# Patient Record
Sex: Female | Born: 1955 | Race: Black or African American | Hispanic: No | Marital: Single | Smoking: Former smoker
Health system: Southern US, Community
[De-identification: ages and names within clinical notes are randomized; demographics above are authoritative.]

## PROBLEM LIST (undated history)

## (undated) DIAGNOSIS — I10 Essential (primary) hypertension: Secondary | ICD-10-CM

## (undated) DIAGNOSIS — F329 Major depressive disorder, single episode, unspecified: Secondary | ICD-10-CM

## (undated) DIAGNOSIS — K921 Melena: Secondary | ICD-10-CM

## (undated) DIAGNOSIS — T7840XA Allergy, unspecified, initial encounter: Secondary | ICD-10-CM

## (undated) DIAGNOSIS — E079 Disorder of thyroid, unspecified: Secondary | ICD-10-CM

## (undated) DIAGNOSIS — E785 Hyperlipidemia, unspecified: Secondary | ICD-10-CM

## (undated) DIAGNOSIS — F419 Anxiety disorder, unspecified: Secondary | ICD-10-CM

## (undated) DIAGNOSIS — F32A Depression, unspecified: Secondary | ICD-10-CM

## (undated) DIAGNOSIS — R109 Unspecified abdominal pain: Secondary | ICD-10-CM

## (undated) HISTORY — DX: Allergy, unspecified, initial encounter: T78.40XA

## (undated) HISTORY — DX: Major depressive disorder, single episode, unspecified: F32.9

## (undated) HISTORY — DX: Melena: K92.1

## (undated) HISTORY — DX: Anxiety disorder, unspecified: F41.9

## (undated) HISTORY — DX: Unspecified abdominal pain: R10.9

## (undated) HISTORY — DX: Disorder of thyroid, unspecified: E07.9

## (undated) HISTORY — DX: Depression, unspecified: F32.A

---

## 1993-01-16 HISTORY — PX: ABDOMINAL HYSTERECTOMY: SHX81

## 2010-12-19 DIAGNOSIS — E669 Obesity, unspecified: Secondary | ICD-10-CM | POA: Insufficient documentation

## 2011-01-17 HISTORY — PX: COLONOSCOPY: SHX174

## 2012-05-09 ENCOUNTER — Other Ambulatory Visit (HOSPITAL_COMMUNITY): Payer: Self-pay | Admitting: Family Medicine

## 2012-05-09 DIAGNOSIS — R102 Pelvic and perineal pain: Secondary | ICD-10-CM

## 2012-05-10 ENCOUNTER — Ambulatory Visit (HOSPITAL_COMMUNITY)
Admission: RE | Admit: 2012-05-10 | Discharge: 2012-05-10 | Disposition: A | Discharge status: Home / Self Care | Payer: BC Managed Care – PPO | Source: Ambulatory Visit | Attending: Family Medicine | Admitting: Family Medicine

## 2012-05-10 DIAGNOSIS — Z90710 Acquired absence of both cervix and uterus: Secondary | ICD-10-CM | POA: Insufficient documentation

## 2012-05-10 DIAGNOSIS — R102 Pelvic and perineal pain: Secondary | ICD-10-CM

## 2012-05-10 DIAGNOSIS — N949 Unspecified condition associated with female genital organs and menstrual cycle: Secondary | ICD-10-CM | POA: Insufficient documentation

## 2013-06-16 ENCOUNTER — Encounter (HOSPITAL_COMMUNITY): Payer: Self-pay | Admitting: Emergency Medicine

## 2013-06-16 ENCOUNTER — Emergency Department (HOSPITAL_COMMUNITY)
Admission: EM | Admit: 2013-06-16 | Discharge: 2013-06-16 | Disposition: A | Discharge status: Home / Self Care | Payer: Worker's Compensation | Attending: Emergency Medicine | Admitting: Emergency Medicine

## 2013-06-16 ENCOUNTER — Emergency Department (HOSPITAL_COMMUNITY): Payer: Worker's Compensation

## 2013-06-16 DIAGNOSIS — W010XXA Fall on same level from slipping, tripping and stumbling without subsequent striking against object, initial encounter: Secondary | ICD-10-CM | POA: Diagnosis not present

## 2013-06-16 DIAGNOSIS — Z87891 Personal history of nicotine dependence: Secondary | ICD-10-CM | POA: Diagnosis not present

## 2013-06-16 DIAGNOSIS — S5010XA Contusion of unspecified forearm, initial encounter: Secondary | ICD-10-CM | POA: Diagnosis not present

## 2013-06-16 DIAGNOSIS — I10 Essential (primary) hypertension: Secondary | ICD-10-CM | POA: Insufficient documentation

## 2013-06-16 DIAGNOSIS — S6990XA Unspecified injury of unspecified wrist, hand and finger(s), initial encounter: Secondary | ICD-10-CM | POA: Diagnosis present

## 2013-06-16 DIAGNOSIS — Y9289 Other specified places as the place of occurrence of the external cause: Secondary | ICD-10-CM | POA: Insufficient documentation

## 2013-06-16 DIAGNOSIS — Z79899 Other long term (current) drug therapy: Secondary | ICD-10-CM | POA: Insufficient documentation

## 2013-06-16 DIAGNOSIS — S59909A Unspecified injury of unspecified elbow, initial encounter: Secondary | ICD-10-CM | POA: Diagnosis present

## 2013-06-16 DIAGNOSIS — Y939 Activity, unspecified: Secondary | ICD-10-CM | POA: Insufficient documentation

## 2013-06-16 DIAGNOSIS — IMO0002 Reserved for concepts with insufficient information to code with codable children: Secondary | ICD-10-CM | POA: Insufficient documentation

## 2013-06-16 DIAGNOSIS — S5011XA Contusion of right forearm, initial encounter: Secondary | ICD-10-CM

## 2013-06-16 HISTORY — DX: Essential (primary) hypertension: I10

## 2013-06-16 NOTE — Discharge Instructions (Signed)
Apply ice to your arm. Take ibuprofen, 600 800 mg every 6-8 hours as needed for pain.  Contusion A contusion is a deep bruise. Contusions are the result of an injury that caused bleeding under the skin. The contusion may turn blue, purple, or yellow. Minor injuries will give you a painless contusion, but more severe contusions may stay painful and swollen for a few weeks.  CAUSES  A contusion is usually caused by a blow, trauma, or direct force to an area of the body. SYMPTOMS   Swelling and redness of the injured area.  Bruising of the injured area.  Tenderness and soreness of the injured area.  Pain. DIAGNOSIS  The diagnosis can be made by taking a history and physical exam. An X-ray, CT scan, or MRI may be needed to determine if there were any associated injuries, such as fractures. TREATMENT  Specific treatment will depend on what area of the body was injured. In general, the best treatment for a contusion is resting, icing, elevating, and applying cold compresses to the injured area. Over-the-counter medicines may also be recommended for pain control. Ask your caregiver what the best treatment is for your contusion. HOME CARE INSTRUCTIONS   Put ice on the injured area.  Put ice in a plastic bag.  Place a towel between your skin and the bag.  Leave the ice on for 15-20 minutes, 03-04 times a day.  Only take over-the-counter or prescription medicines for pain, discomfort, or fever as directed by your caregiver. Your caregiver may recommend avoiding anti-inflammatory medicines (aspirin, ibuprofen, and naproxen) for 48 hours because these medicines may increase bruising.  Rest the injured area.  If possible, elevate the injured area to reduce swelling. SEEK IMMEDIATE MEDICAL CARE IF:   You have increased bruising or swelling.  You have pain that is getting worse.  Your swelling or pain is not relieved with medicines. MAKE SURE YOU:   Understand these instructions.  Will  watch your condition.  Will get help right away if you are not doing well or get worse. Document Released: 10/12/2004 Document Revised: 03/27/2011 Document Reviewed: 11/07/2010 [facility] Patient Information 2014 [place], [place].

## 2013-06-16 NOTE — ED Provider Notes (Signed)
Discussed case with Johnnette Gourd, PA-C. Transfer of care from [employer], PA-C at change in shift.   Plan: Xray - anticipate discharge.   Dg Forearm Right  06/16/2013   CLINICAL DATA:  Traumatic injury and pain  EXAM: RIGHT FOREARM - 2 VIEW  COMPARISON:  None.  FINDINGS: There is no evidence of fracture or other focal bone lesions. Soft tissues are unremarkable.  IMPRESSION: No acute abnormality noted.   Electronically Signed   By: Alcide Clever M.D.   On: 06/16/2013 16:08   Alert and oriented. GCS 15. Heart rate and rhythm normal. Lungs clear to auscultation to upper and lower lobes bilaterally. Lungs clear to auscultation. Radial pulses 2+ bilaterally. Cap refill less than 3 seconds. Ecchymosis with superficial abrasions identified to the extensor surface of the right forearm. Negative snuffbox tenderness to the right wrist. Full range of motion to the right wrist without difficulty or ataxia. Negative pain upon palpation to the right hand and right wrist. Mild discomfort upon palpation to the area of ecchymosis of the right forearm. Negative pain upon palpation to the right elbow-full range of motion noted without difficulty or ataxia. Strength intact to the MCP, PIP, DIP joints of right hand. Equal grip strength noted.  Plain film of right forearm negative for acute osseous injury. Strength intact with equal distribution. Negative focal neurological deficits noted. Pulses palpable and strong. Patient neurovascularly intact. Patient stable, afebrile. Patient not septic appearing. Discharged patient. Patient placed in wrist brace for comfort. Discussed with patient to rest, ice, elevate. Suspicion to be contusion. Doubt compartment syndrome. Doubt ischemia. Negative findings for fractures or acute bony abnormalities. Referred to PCP and hand specialist. Discussed with patient to closely monitor symptoms and if symptoms are to worsen or change to report back to the ED - strict return instructions given.   Patient agreed to plan of care, understood, all questions answered.   Diagnoses that have been ruled out:  None  Diagnoses that are still under consideration:  None  Final diagnoses:  Contusion of right lower arm    Filed Vitals:   06/16/13 1322  BP: 121/80  Pulse: 96  Temp: 97.8 F (36.6 C)  TempSrc: Oral  Resp: 20  SpO2: 97%      [employer], PA-C 06/16/13 1651

## 2013-06-16 NOTE — ED Notes (Signed)
Patient states she fell Saturday at work in the bathroom when she slipped on some water.   Patient states unsure how she landed and unsure what she hit.   Patient complains of R wrist / arm pain.   Patient presents with bruising, swelling and abrasions.

## 2013-06-17 NOTE — ED Provider Notes (Signed)
Medical screening examination/treatment/procedure(s) were performed by non-physician practitioner and as supervising physician I was immediately available for consultation/collaboration.     Geoffery Lyons, MD 06/17/13 [phone]

## 2013-07-01 NOTE — ED Provider Notes (Signed)
CSN: [id number]     Arrival date & time 06/16/13  1316 History   First MD Initiated Contact with Patient 06/16/13 1329     Chief Complaint  Patient presents with  . Fall     (Consider location/radiation/quality/duration/timing/severity/associated sxs/prior Treatment) HPI Comments: This is a note from a visit on 6/1 that was deleted by the scribe, Sharmon LeydenKayla Anderson. 58 y/o female presents to the ED complaining of right wrist and arm pain x3 days after slipping on water in the bathroom at work landing on her right arm. States it started to bruise shortly after. Pain is constant and worse with movement of her right wrist, severe. Denies numbness or tingling. No head injury or LOC. She has not had any alleviating factors.  Patient is a 58 y.o. female presenting with fall. The history is provided by the patient.  Fall Pertinent negatives include no numbness.    Past Medical History  Diagnosis Date  . Hypertension    Past Surgical History  Procedure Laterality Date  . Abdominal hysterectomy     No family history on file. History  Substance Use Topics  . Smoking status: Former Games developermoker  . Smokeless tobacco: Not on file  . Alcohol Use: No   OB History   Grav Para Term Preterm Abortions TAB SAB Ect Mult Living                 Review of Systems  Musculoskeletal:       Positive for right forearm and wrist pain.  Skin: Positive for wound.  Neurological: Negative for numbness.  All other systems reviewed and are negative.     Allergies  Review of patient's allergies indicates no known allergies.  Home Medications   Prior to Admission medications   Medication Sig Start Date End Date Taking? Authorizing Provider  Cholecalciferol (VITAMIN D PO) Take 1 tablet by mouth daily.   Yes Historical Provider, MD  hydrochlorothiazide (MICROZIDE) 12.5 MG capsule Take 12.5 mg by mouth daily.   Yes Historical Provider, MD  LISINOPRIL PO Take 1 tablet by mouth daily.   Yes Historical Provider,  MD  SIMVASTATIN PO Take 1 tablet by mouth daily.   Yes Historical Provider, MD   BP 135/86  Pulse 66  Temp(Src) 98.5 F (36.9 C) (Oral)  Resp 20  SpO2 100% Physical Exam  Nursing note and vitals reviewed. Constitutional: She is oriented to person, place, and time. She appears well-developed and well-nourished. No distress.  HENT:  Head: Normocephalic and atraumatic.  Mouth/Throat: Oropharynx is clear and moist.  Eyes: Conjunctivae and EOM are normal.  Neck: Normal range of motion. Neck supple.  Cardiovascular: Normal rate, regular rhythm and normal heart sounds.   +2 radial pulse on right.  Pulmonary/Chest: Effort normal and breath sounds normal. No respiratory distress.  Musculoskeletal: Normal range of motion. She exhibits no edema.  Right forearm TTP dorsally with overlying ecchymosis and superficial abrasion. No active bleeding. Right wrist no TTP, full ROM. Right elbow normal.  Neurological: She is alert and oriented to person, place, and time. No sensory deficit.  Sensation intact. Equal grip strength bilateral.  Skin: Skin is warm and dry.  Psychiatric: She has a normal mood and affect. Her behavior is normal.    ED Course  Procedures (including critical care time) Labs Review Labs Reviewed - No data to display  Imaging Review No results found.   EKG Interpretation None      MDM   Final diagnoses:  Contusion of right  lower arm   Pt presenting with right arm pain after fall. She is well appearing and in NAD. VSS. Neurovascularly intact. Forearm xray was ordered and pending at shift change, pt care was signed out to [employer]issa [employer], PA-C.  Trevor MaceRobyn M Albert, PA-C 07/01/13 1601

## 2013-07-02 NOTE — ED Provider Notes (Signed)
Medical screening examination/treatment/procedure(s) were performed by non-physician practitioner and as supervising physician I was immediately available for consultation/collaboration.     Douglas Delo, MD 07/02/13 1459 

## 2015-08-27 ENCOUNTER — Other Ambulatory Visit (HOSPITAL_COMMUNITY): Payer: Self-pay | Admitting: *Deleted

## 2015-08-27 DIAGNOSIS — M79622 Pain in left upper arm: Secondary | ICD-10-CM

## 2015-09-15 ENCOUNTER — Telehealth (HOSPITAL_COMMUNITY): Payer: Self-pay | Admitting: *Deleted

## 2015-09-15 NOTE — Telephone Encounter (Signed)
Telephoned patient at home number and left message about [facility] appointment Thursday August 31 3:00

## 2015-09-16 ENCOUNTER — Ambulatory Visit
Admission: RE | Admit: 2015-09-16 | Discharge: 2015-09-16 | Disposition: A | Discharge status: Home / Self Care | Payer: No Typology Code available for payment source | Source: Ambulatory Visit | Attending: Obstetrics and Gynecology | Admitting: Obstetrics and Gynecology

## 2015-09-16 ENCOUNTER — Other Ambulatory Visit (HOSPITAL_COMMUNITY): Payer: Self-pay | Admitting: *Deleted

## 2015-09-16 ENCOUNTER — Ambulatory Visit (HOSPITAL_COMMUNITY)
Admission: RE | Admit: 2015-09-16 | Discharge: 2015-09-16 | Disposition: A | Discharge status: Home / Self Care | Payer: Self-pay | Source: Ambulatory Visit | Attending: Obstetrics and Gynecology | Admitting: Obstetrics and Gynecology

## 2015-09-16 ENCOUNTER — Ambulatory Visit
Admission: RE | Admit: 2015-09-16 | Discharge: 2015-09-16 | Disposition: A | Discharge status: Home / Self Care | Payer: Self-pay | Source: Ambulatory Visit | Attending: Obstetrics and Gynecology | Admitting: Obstetrics and Gynecology

## 2015-09-16 ENCOUNTER — Encounter (HOSPITAL_COMMUNITY): Payer: Self-pay

## 2015-09-16 VITALS — BP 122/84 | Temp 98.5°F | Ht 67.0 in | Wt 265.0 lb

## 2015-09-16 DIAGNOSIS — Z1239 Encounter for other screening for malignant neoplasm of breast: Secondary | ICD-10-CM

## 2015-09-16 DIAGNOSIS — N644 Mastodynia: Secondary | ICD-10-CM

## 2015-09-16 DIAGNOSIS — M79622 Pain in left upper arm: Secondary | ICD-10-CM

## 2015-09-16 HISTORY — DX: Hyperlipidemia, unspecified: E78.5

## 2015-09-16 NOTE — Patient Instructions (Signed)
Explained breast self awareness to [name]. Patient did not need a Pap smear today due to her history of a hysterectomy for benign reasons. Informed patient that she no longer needs Pap smears due to her history of a hysterectomy for benign reasons. [facility] and possible left breast ultrasound. Appointment scheduled for Thursday, September 16, 2015 at 1540. Sandra JarvisSherri Alvarez verbalized understanding.  [place], Kathaleen Maserhristine Poll, RN 9:52 PM

## 2015-09-16 NOTE — Progress Notes (Signed)
Complaints of left axillary lump x 3 weeks that has decreased in size. Complaints of bilateral axillary pain x 5 weeks that radiates to the breast. Patient states the pain comes and goes. Patient rates the pain at a 3 out of 10.  Pap Smear: Pap smear not completed today. [facility] and normal per patient. Per patient has no history of an abnormal Pap smear. Patient has a history of a hysterectomy in 1995 for fibroids. Patient doesn't need any further Pap smears due to her history of a hysterectomy for benign reasons. No Pap smear results are in EPIC.  Physical exam: Breasts Breasts symmetrical. No skin abnormalities bilateral breasts. No nipple retraction bilateral breasts. No nipple discharge bilateral breasts. No lymphadenopathy. No lumps palpated bilateral breasts. Unable to palpated any lumps in patients area of concern. Complaints of right outer breast and left axillary tenderness on exam. [facility] and possible left breast ultrasound. Appointment scheduled for Thursday, September 16, 2015 at 1540.        Pelvic/Bimanual No Pap smear completed today since patient has history of a hysterectomy for benign reasons.. Pap smear not indicated per BCCCP guidelines.   Smoking History: Patient has never smoked.  Patient Navigation: Patient education provided. Access to services provided for patient through BCCCP program.   Colorectal Cancer Screening: Per patient had a colonoscopy completed 5 years ago. No complaints today.

## 2015-09-17 ENCOUNTER — Encounter (HOSPITAL_COMMUNITY): Payer: Self-pay | Admitting: *Deleted

## 2015-10-11 ENCOUNTER — Telehealth (HOSPITAL_COMMUNITY): Payer: Self-pay | Admitting: *Deleted

## 2015-10-11 NOTE — Telephone Encounter (Signed)
Patient called and left voicemail in regards to a bill received. Attempted to call patient back and no one answered phone. Left voicemail for patient to call me back.

## 2015-10-12 ENCOUNTER — Telehealth (HOSPITAL_COMMUNITY): Payer: Self-pay | Admitting: *Deleted

## 2015-10-12 NOTE — Telephone Encounter (Signed)
Patient called me back and left voicemail. Called patient back. Patient stated she got a [facility] her [facility] and breast ultrasound on 09/16/15. Let patient know that it is covered by BCCCP. Told patient that I will need a copy of the bill and will send it to [facility] to have the charges removed from her account and billed to [facility]. Patient would like to e-mail me a copy of the bill. Gave patient my e-mail address. Let her know I will send it to be corrected after I receive. Patient verbalized understanding.

## 2017-11-28 ENCOUNTER — Ambulatory Visit: Payer: Self-pay | Admitting: Emergency Medicine

## 2018-01-01 ENCOUNTER — Encounter

## 2018-01-01 ENCOUNTER — Ambulatory Visit: Payer: Self-pay | Admitting: Emergency Medicine

## 2018-03-29 ENCOUNTER — Ambulatory Visit: Payer: Self-pay | Admitting: Emergency Medicine

## 2018-04-10 ENCOUNTER — Ambulatory Visit (INDEPENDENT_AMBULATORY_CARE_PROVIDER_SITE_OTHER): Payer: No Typology Code available for payment source | Admitting: Medical

## 2018-04-10 ENCOUNTER — Encounter: Payer: Self-pay | Admitting: Medical

## 2018-04-10 ENCOUNTER — Other Ambulatory Visit: Payer: Self-pay

## 2018-04-10 VITALS — BP 124/80 | HR 77 | Temp 97.9°F | Ht 65.5 in | Wt 281.8 lb

## 2018-04-10 DIAGNOSIS — F329 Major depressive disorder, single episode, unspecified: Secondary | ICD-10-CM | POA: Insufficient documentation

## 2018-04-10 DIAGNOSIS — Z1211 Encounter for screening for malignant neoplasm of colon: Secondary | ICD-10-CM | POA: Insufficient documentation

## 2018-04-10 DIAGNOSIS — R21 Rash and other nonspecific skin eruption: Secondary | ICD-10-CM | POA: Insufficient documentation

## 2018-04-10 DIAGNOSIS — F419 Anxiety disorder, unspecified: Secondary | ICD-10-CM

## 2018-04-10 DIAGNOSIS — K635 Polyp of colon: Secondary | ICD-10-CM

## 2018-04-10 DIAGNOSIS — Z1239 Encounter for other screening for malignant neoplasm of breast: Secondary | ICD-10-CM

## 2018-04-10 DIAGNOSIS — Z8262 Family history of osteoporosis: Secondary | ICD-10-CM | POA: Insufficient documentation

## 2018-04-10 DIAGNOSIS — Z780 Asymptomatic menopausal state: Secondary | ICD-10-CM

## 2018-04-10 DIAGNOSIS — I10 Essential (primary) hypertension: Secondary | ICD-10-CM | POA: Insufficient documentation

## 2018-04-10 DIAGNOSIS — G8929 Other chronic pain: Secondary | ICD-10-CM

## 2018-04-10 DIAGNOSIS — Z1159 Encounter for screening for other viral diseases: Secondary | ICD-10-CM

## 2018-04-10 DIAGNOSIS — Z0000 Encounter for general adult medical examination without abnormal findings: Secondary | ICD-10-CM | POA: Diagnosis not present

## 2018-04-10 DIAGNOSIS — E079 Disorder of thyroid, unspecified: Secondary | ICD-10-CM

## 2018-04-10 DIAGNOSIS — E2839 Other primary ovarian failure: Secondary | ICD-10-CM | POA: Insufficient documentation

## 2018-04-10 DIAGNOSIS — Z8709 Personal history of other diseases of the respiratory system: Secondary | ICD-10-CM

## 2018-04-10 DIAGNOSIS — Z90710 Acquired absence of both cervix and uterus: Secondary | ICD-10-CM | POA: Insufficient documentation

## 2018-04-10 DIAGNOSIS — E559 Vitamin D deficiency, unspecified: Secondary | ICD-10-CM

## 2018-04-10 DIAGNOSIS — E785 Hyperlipidemia, unspecified: Secondary | ICD-10-CM

## 2018-04-10 DIAGNOSIS — M25562 Pain in left knee: Secondary | ICD-10-CM

## 2018-04-10 DIAGNOSIS — H0260 Xanthelasma of unspecified eye, unspecified eyelid: Secondary | ICD-10-CM | POA: Insufficient documentation

## 2018-04-10 DIAGNOSIS — Z7185 Encounter for immunization safety counseling: Secondary | ICD-10-CM | POA: Insufficient documentation

## 2018-04-10 DIAGNOSIS — Z23 Encounter for immunization: Secondary | ICD-10-CM

## 2018-04-10 DIAGNOSIS — Z7189 Other specified counseling: Secondary | ICD-10-CM

## 2018-04-10 DIAGNOSIS — Z113 Encounter for screening for infections with a predominantly sexual mode of transmission: Secondary | ICD-10-CM

## 2018-04-10 LAB — POCT URINALYSIS DIP (PROADVANTAGE DEVICE)
BILIRUBIN UA: NEGATIVE
Blood, UA: NEGATIVE
Glucose, UA: NEGATIVE mg/dL
Ketones, POC UA: NEGATIVE mg/dL
Leukocytes, UA: NEGATIVE
Nitrite, UA: NEGATIVE
Protein Ur, POC: NEGATIVE mg/dL
Specific Gravity, Urine: 1.03
UUROB: 3.5
pH, UA: 6 (ref 5.0–8.0)

## 2018-04-10 NOTE — Progress Notes (Signed)
Subjective: Chief Complaint  Patient presents with  . other    non fasting CPE Last ate within the hour   Was seeing indigent care center prior, [facility].   [facility] on [place].    Sees eye doctor, dentist Last colonoscopy [facility] 2013  Concerns: Having left knee problems.  Left knee pain and some knee swelling the past year.   Last xray was several years ago.  Dr. Magnus Ivan.  No prior surgery.     Having some stiffness of joints for a few years.     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  No hx/o TB.   Depression and anxiety - wants to get back on track taking them regularly.  Current taking Citalopram  daily, Buspar prn.    S/p hysterectomy.  No current concerns for STD.   Has hx/o STD though.  Reviewed their medical, surgical, family, social, medication, and allergy history and updated chart as appropriate.  Past Medical History:  Diagnosis Date  . Allergy   . Anxiety   . Depression    prior [facility] at [facility]  . Hyperlipidemia   . Hypertension   . Thyroid disease     Past Surgical History:  Procedure Laterality Date  . ABDOMINAL HYSTERECTOMY  1995   due to fibroid uterus, still has ovaries  . COLONOSCOPY  2013   polyps    Social History   Socioeconomic History  . Marital status: Single    Spouse name: Not on file  . Number of children: Not on file  . Years of education: Not on file  . Highest education level: Not on file  Occupational History  . Not on file  Social Needs  . Financial resource strain: Not on file  . Food insecurity:    Worry: Not on file    Inability: Not on file  . Transportation needs:    Medical: Not on file    Non-medical: Not on file  Tobacco Use  . Smoking status: Former Games developer  . Smokeless tobacco: Never Used  Substance and Sexual Activity  . Alcohol use: No  . Drug use: No  . Sexual activity: Not on file  Lifestyle  . Physical activity:    Days per week: Not on file     Minutes per session: Not on file  . Stress: Not on file  Relationships  . Social connections:    Talks on phone: Not on file    Gets together: Not on file    Attends religious service: Not on file    Active member of club or organization: Not on file    Attends meetings of clubs or organizations: Not on file    Relationship status: Not on file  . Intimate partner violence:    Fear of current or ex partner: Not on file    Emotionally abused: Not on file    Physically abused: Not on file    Forced sexual activity: Not on file  Other Topics Concern  . Not on file  Social History Narrative   Lives alone.  Was homeless back in 2000 for short period of time.   Is a caregiver.  Works [employer] as well.  Exercise - not a whole lot.    03/2018.    Family History  Problem Relation Age of Onset  . Breast cancer Mother   . Cancer Mother   . Glaucoma Mother   . Cancer Father   .  Cancer Maternal Grandmother        colon     Current Outpatient Medications:  .  busPIRone (BUSPAR) 10 MG tablet, Take 10 mg by mouth 3 (three) times daily., Disp: , Rfl:  .  hydrochlorothiazide (MICROZIDE) 12.5 MG capsule, Take 25 mg by mouth daily. , Disp: , Rfl:  .  losartan (COZAAR) 50 MG tablet, Take 50 mg by mouth daily. Pt takes 1/2 of tablet., Disp: , Rfl:  .  triamcinolone cream (KENALOG) 0.1 %, Apply 1 application topically 2 (two) times daily., Disp: , Rfl:  .  Cholecalciferol (VITAMIN D PO), Take 1 tablet by mouth daily., Disp: , Rfl:  .  citalopram (CELEXA) 20 MG tablet, Take 20 mg by mouth daily., Disp: , Rfl:  .  LISINOPRIL PO, Take 1 tablet by mouth daily., Disp: , Rfl:  .  SIMVASTATIN PO, Take 1 tablet by mouth daily., Disp: , Rfl:   Allergies  Allergen Reactions  . Lisinopril     cough  . Simvastatin     Memory fog     Review of Systems Constitutional: -fever, -chills, -sweats, -unexpected weight change, -decreased appetite, -fatigue Allergy: -sneezing, -itching,  -congestion Dermatology: -changing moles, --rash, -lumps ENT: -runny nose, -ear pain, -sore throat, -hoarseness, -sinus pain, -teeth pain, - ringing in ears, -hearing loss, -nosebleeds Cardiology: -chest pain, -palpitations, -swelling, -difficulty breathing when lying flat, -waking up short of breath Respiratory: -cough, -shortness of breath, -difficulty breathing with exercise or exertion, -wheezing, -coughing up blood Gastroenterology: -abdominal pain, -nausea, -vomiting, -diarrhea, -constipation, -blood in stool, -changes in bowel movement, -difficulty swallowing or eating Hematology: -bleeding, -bruising  Musculoskeletal: -joint aches, -muscle aches, -joint swelling, -back pain, -neck pain, -cramping, -changes in gait Ophthalmology: denies vision changes, eye redness, itching, discharge Urology: -burning with urination, -difficulty urinating, -blood in urine, -urinary frequency, -urgency, -incontinence Neurology: -headache, -weakness, -tingling, -numbness, -memory loss, -falls, -dizziness Psychology: -depressed mood, -agitation, -sleep problems Breast/gyn: -breast tenderness, -discharge, -lumps, -vaginal discharge,- irregular periods, -heavy periods     Objective:  BP 124/80 (BP Location: Left Arm, Patient Position: Sitting)   Pulse 77   Temp 97.9 F (36.6 C)   Ht 5' 5.5" (1.664 m)   Wt 281 lb 12.8 oz (127.8 kg)   SpO2 99%   BMI 46.18 kg/m   General appearance: alert, no distress, WD/WN, African American female Skin: scattered macules, several skin tags scattered circumferentially around neck bilat, yellowish slight raised linear lesions of upper eyelids c/w xanthelasma, no worrisome lesions HEENT: normocephalic, conjunctiva/corneas normal, sclerae anicteric, PERRLA, EOMi, nares patent, no discharge or erythema, pharynx normal Oral cavity: MMM, tongue normal, teeth in good repair Neck: supple, no lymphadenopathy, no thyromegaly, no masses, normal ROM, no bruits Chest: non tender,  normal shape and expansion Heart: RRR, normal S1, S2, no murmurs Lungs: CTA bilaterally, no wheezes, rhonchi, or rales Abdomen: +bs, soft, non tender, non distended, no masses, no hepatomegaly, no splenomegaly, no bruits Back: non tender, normal ROM, no scoliosis Musculoskeletal: Left knee with mild tenderness in general, no obvious swelling, tender over patella tendon, no laxity, mild pain with range of motion which is slightly reduced in general, otherwise upper extremities non tender, no obvious deformity, normal ROM throughout, lower extremities non tender, no obvious deformity, normal ROM throughout Extremities: no edema, no cyanosis, no clubbing Pulses: 2+ symmetric, upper and lower extremities, normal cap refill Neurological: alert, oriented x 3, CN2-12 intact, strength normal upper extremities and lower extremities, sensation normal throughout, DTRs 2+ throughout, no cerebellar signs, gait  normal Psychiatric: normal affect, behavior normal, pleasant  Breast: nontender, no masses or lumps, no skin changes, no nipple discharge or inversion, no axillary lymphadenopathy Gyn: Normal external genitalia without lesions, vagina with normal mucosa, s/p hysterectomy, no abnormal vaginal discharge.  Adnexa not enlarged, nontender, no masses.  Exam chaperoned by nurse. Rectal: anus normal appearing   Assessment and Plan :   Encounter Diagnoses  Name Primary?  . Encounter for health maintenance examination in adult Yes  . Essential hypertension, benign   . Hyperlipidemia, unspecified hyperlipidemia type   . Screening for breast cancer   . Screen for colon cancer   . Polyp of colon, unspecified part of colon, unspecified type   . S/P hysterectomy   . Anxiety and depression   . Need for Tdap vaccination   . Thyroid disease   . Vitamin D deficiency   . Vaccine counseling   . History of bronchitis   . Chronic pain of left knee   . Morbid obesity (HCC)   . Family history of osteoporosis   .  Estrogen deficiency   . Post-menopausal   . Encounter for hepatitis C screening test for low risk patient   . Screen for STD (sexually transmitted disease)   . Xanthelasma     Physical exam - discussed and counseled on healthy lifestyle, diet, exercise, preventative care, vaccinations, sick and well care, proper use of emergency dept and after hours care, and addressed their concerns.    Health screening: Advised they see their eye doctor yearly for routine vision care. Advised they see their dentist yearly for routine dental care including hygiene visits twice yearly.   Bone density - recommended bone density evaluation   Discussed STD testing, discussed prevention, condom use, means of transmission  Cancer screening Counseled on self breast exams, mammograms, cervical cancer screening  Colonoscopy:  Referred for updated colonoscopy  Vaccinations: Advised yearly influenza vaccine Counseled on the Tdap (tetanus, diptheria, and acellular pertussis) vaccine.  Vaccine information sheet given. Tdap vaccine given after consent obtained.   Acute issues discussed: none  Separate significant chronic issues discussed: Hypertension- continue same medication  Hyperlipidemia- wants to avoid statins.  Follow-up pending labs  Vitamin D deficiency-repeat labs today.  Continue supplement.  Obesity-needs work on lifestyle changes and weight loss  Depression anxiety- consider increasing citalopram, consider changing buspirone to daily not as needed, consider counseling  Sandra Alvarez was seen today for other.  Diagnoses and all orders for this visit:  Encounter for health maintenance examination in adult -     Comprehensive metabolic panel -     CBC -     VITAMIN D 25 Hydroxy (Vit-D Deficiency, Fractures) -     Hemoglobin A1c -     Lipid panel -     MM DIGITAL SCREENING BILATERAL; Future -     DG Bone Density; Future -     HIV Antibody (routine testing w rflx) -     RPR -      GC/Chlamydia Probe Amp -     Hepatitis C antibody -     POCT Urinalysis DIP (Proadvantage Device)  Essential hypertension, benign  Hyperlipidemia, unspecified hyperlipidemia type  Screening for breast cancer -     MM DIGITAL SCREENING BILATERAL; Future  Screen for colon cancer -     Ambulatory referral to Gastroenterology  Polyp of colon, unspecified part of colon, unspecified type -     Ambulatory referral to Gastroenterology  S/P hysterectomy  Anxiety and depression  Need  for Tdap vaccination -     Tdap vaccine greater than or equal to 7yo IM  Thyroid disease  Vitamin D deficiency  Vaccine counseling  History of bronchitis  Chronic pain of left knee -     Ambulatory referral to Orthopedic Surgery  Morbid obesity (HCC)  Family history of osteoporosis  Estrogen deficiency -     DG Bone Density; Future  Post-menopausal -     DG Bone Density; Future  Encounter for hepatitis C screening test for low risk patient -     Hepatitis C antibody  Screen for STD (sexually transmitted disease) -     HIV Antibody (routine testing w rflx) -     RPR -     GC/Chlamydia Probe Amp -     Hepatitis C antibody  Xanthelasma   Follow-up pending labs, yearly for physical

## 2018-04-10 NOTE — Patient Instructions (Addendum)
Thanks for trusting [place] with your health care and for coming in for a physical today.  Below are some general recommendations I have for you:  Yearly screenings See your eye doctor yearly for routine vision care. See your dentist yearly for routine dental care including hygiene visits twice yearly. See me here yearly for a routine physical and preventative care visit   Cancer screening Colon cancer screening:   We will refer you for screening colonoscopy   Breast cancer screening -  Please call to schedule your mammogram at your convenience.  [facility]   Or    [facility]   [phone]          [phone] [address]       [address] [place], [place] [address]        [place], [place] [address]  Osteoporosis screening/Bone Density test - I recommend a bone density test if you are over 56 years old, or under 41 years old if you have risk factors such as a bone fracture as an adult, parent with history of bone fracture as adult, thyroid disease, early menopause, or underweight for example.   Specific Concerns today:  . We will refer you for orthopedics to Dr. Magnus Ivan   Please follow up yearly for a physical.    I have included other useful information below for your review.  Preventative Care for Adults - Female      MAINTAIN REGULAR HEALTH EXAMS:  A routine yearly physical is a good way to check in with your primary care provider about your health and preventive screening. It is also an opportunity to share updates about your health and any concerns you have, and receive a thorough all-over exam.   Most health insurance companies pay for at least some preventative services.  Check with your health plan for specific coverages.  WHAT PREVENTATIVE SERVICES DO WOMEN NEED?  Adult women should have their weight and blood pressure checked regularly.   Women age 33 and older should have their cholesterol levels  checked regularly.  Women should be screened for cervical cancer with a Pap smear and pelvic exam beginning at either age 42, or 3 years after they become sexually activity.    Breast cancer screening generally begins at age 73 with a mammogram and breast exam by your primary care provider.    Beginning at age 70 and continuing to age 70, women should be screened for colorectal cancer.  Certain people may need continued testing until age 59.  Updating vaccinations is part of preventative care.  Vaccinations help protect against diseases such as the flu.  Osteoporosis is a disease in which the bones lose minerals and strength as we age. Women ages 47 and over should discuss this with their caregivers, as should women after menopause who have other risk factors.  Lab tests are generally done as part of preventative care to screen for anemia and blood disorders, to screen for problems with the kidneys and liver, to screen for bladder problems, to check blood sugar, and to check your cholesterol level.  Preventative services generally include counseling about diet, exercise, avoiding tobacco, drugs, excessive alcohol consumption, and sexually transmitted infections.    GENERAL RECOMMENDATIONS FOR GOOD HEALTH:  Healthy diet:  Eat a variety of foods, including fruit, vegetables, animal or vegetable protein, such as meat, fish, chicken, and eggs, or beans, lentils, tofu, and grains, such as rice.  Drink plenty of  water daily.  Decrease saturated fat in the diet, avoid lots of red meat, processed foods, sweets, fast foods, and fried foods.  Exercise:  Aerobic exercise helps maintain good heart health. At least 30-40 minutes of moderate-intensity exercise is recommended. For example, a brisk walk that increases your heart rate and breathing. This should be done on most days of the week.   Find a type of exercise or a variety of exercises that you enjoy so that it becomes a part of your daily  life.  Examples are running, walking, swimming, water aerobics, and biking.  For motivation and support, explore group exercise such as aerobic class, spin class, Zumba, Yoga,or  martial arts, etc.    Set exercise goals for yourself, such as a certain weight goal, walk or run in a race such as a 5k walk/run.  Speak to your primary care provider about exercise goals.  Disease prevention:  If you smoke or chew tobacco, find out from your caregiver how to quit. It can literally save your life, no matter how long you have been a tobacco user. If you do not use tobacco, never begin.   Maintain a healthy diet and normal weight. Increased weight leads to problems with blood pressure and diabetes.   The Body Mass Index or BMI is a way of measuring how much of your body is fat. Having a BMI above 27 increases the risk of heart disease, diabetes, hypertension, stroke and other problems related to obesity. Your caregiver can help determine your BMI and based on it develop an exercise and dietary program to help you achieve or maintain this important measurement at a healthful level.  High blood pressure causes heart and blood vessel problems.  Persistent high blood pressure should be treated with medicine if weight loss and exercise do not work.   Fat and cholesterol leaves deposits in your arteries that can block them. This causes heart disease and vessel disease elsewhere in your body.  If your cholesterol is found to be high, or if you have heart disease or certain other medical conditions, then you may need to have your cholesterol monitored frequently and be treated with medication.   Ask if you should have a cardiac stress test if your history suggests this. A stress test is a test done on a treadmill that looks for heart disease. This test can find disease prior to there being a problem.  Menopause can be associated with physical symptoms and risks. Hormone replacement therapy is available to decrease  these. You should talk to your caregiver about whether starting or continuing to take hormones is right for you.   Osteoporosis is a disease in which the bones lose minerals and strength as we age. This can result in serious bone fractures. Risk of osteoporosis can be identified using a bone density scan. Women ages 11 and over should discuss this with their caregivers, as should women after menopause who have other risk factors. Ask your caregiver whether you should be taking a calcium supplement and Vitamin D, to reduce the rate of osteoporosis.   Avoid drinking alcohol in excess (more than two drinks per day).  Avoid use of street drugs. Do not share needles with anyone. Ask for professional help if you need assistance or instructions on stopping the use of alcohol, cigarettes, and/or drugs.  Brush your teeth twice a day with fluoride toothpaste, and floss once a day. Good oral hygiene prevents tooth decay and gum disease. The problems can be  painful, unattractive, and can cause other health problems. Visit your dentist for a routine oral and dental check up and preventive care every 6-12 months.   Look at your skin regularly.  Use a mirror to look at your back. Notify your caregivers of changes in moles, especially if there are changes in shapes, colors, a size larger than a pencil eraser, an irregular border, or development of new moles.  Safety:  Use seatbelts 100% of the time, whether driving or as a passenger.  Use safety devices such as hearing protection if you work in environments with loud noise or significant background noise.  Use safety glasses when doing any work that could send debris in to the eyes.  Use a helmet if you ride a bike or motorcycle.  Use appropriate safety gear for contact sports.  Talk to your caregiver about gun safety.  Use sunscreen with a SPF (or skin protection factor) of 15 or greater.  Lighter skinned people are at a greater risk of skin cancer. Don't forget to  also wear sunglasses in order to protect your eyes from too much damaging sunlight. Damaging sunlight can accelerate cataract formation.   Practice safe sex. Use condoms. Condoms are used for birth control and to help reduce the spread of sexually transmitted infections (or STIs).  Some of the STIs are gonorrhea (the clap), chlamydia, syphilis, trichomonas, herpes, HPV (human papilloma virus) and HIV (human immunodeficiency virus) which causes AIDS. The herpes, HIV and HPV are viral illnesses that have no cure. These can result in disability, cancer and death.   Keep carbon monoxide and smoke detectors in your home functioning at all times. Change the batteries every 6 months or use a model that plugs into the wall.   Vaccinations:  Stay up to date with your tetanus shots and other required immunizations. You should have a booster for tetanus every 10 years. Be sure to get your flu shot every year, since 5%-20% of the [place] population comes down with the flu. The flu vaccine changes each year, so being vaccinated once is not enough. Get your shot in the fall, before the flu season peaks.   Other vaccines to consider:  Human Papilloma Virus or HPV causes cancer of the cervix, and other infections that can be transmitted from person to person. There is a vaccine for HPV, and females should get immunized between the ages of 20 and 68. It requires a series of 3 shots.   Pneumococcal vaccine to protect against certain types of pneumonia.  This is normally recommended for adults age 73 or older.  However, adults younger than 61 years old with certain underlying conditions such as diabetes, heart or lung disease should also receive the vaccine.  Shingles vaccine to protect against Varicella Zoster if you are older than age 60, or younger than 61 years old with certain underlying illness.  If you have not had the Shingrix vaccine, please call your insurer to inquire about coverage for the Shingrix vaccine  given in 2 doses.   Some insurers cover this vaccine after age 82, some cover this after age 42.  If your insurer covers this, then call to schedule appointment to have this vaccine here  Hepatitis A vaccine to protect against a form of infection of the liver by a virus acquired from food.  Hepatitis B vaccine to protect against a form of infection of the liver by a virus acquired from blood or body fluids, particularly if you work in health  care.  If you plan to travel internationally, check with your local health department for specific vaccination recommendations.  Cancer Screening:  Breast cancer screening is essential to preventive care for women. All women age 12 and older should perform a breast self-exam every month. At age 56 and older, women should have their caregiver complete a breast exam each year. Women at ages 49 and older should have a mammogram (x-ray film) of the breasts. Your caregiver can discuss how often you need mammograms.    Cervical cancer screening includes taking a Pap smear (sample of cells examined under a microscope) from the cervix (end of the uterus). It also includes testing for HPV (Human Papilloma Virus, which can cause cervical cancer). Screening and a pelvic exam should begin at age 28, or 3 years after a woman becomes sexually active. Screening should occur every year, with a Pap smear but no HPV testing, up to age 67. After age 69, you should have a Pap smear every 3 years with HPV testing, if no HPV was found previously.   Most routine colon cancer screening begins at the age of 43. On a yearly basis, doctors may provide special easy to use take-home tests to check for hidden blood in the stool. Sigmoidoscopy or colonoscopy can detect the earliest forms of colon cancer and is life saving. These tests use a small camera at the end of a tube to directly examine the colon. Speak to your caregiver about this at age 29, when routine screening begins (and is repeated  every 5 years unless early forms of pre-cancerous polyps or small growths are found).

## 2018-04-11 ENCOUNTER — Telehealth: Payer: Self-pay

## 2018-04-11 LAB — COMPREHENSIVE METABOLIC PANEL
ALBUMIN: 3.9 g/dL (ref 3.8–4.9)
ALT: 20 IU/L (ref 0–32)
AST: 21 IU/L (ref 0–40)
Albumin/Globulin Ratio: 1.1 — ABNORMAL LOW (ref 1.2–2.2)
Alkaline Phosphatase: 91 IU/L (ref 39–117)
BILIRUBIN TOTAL: 0.5 mg/dL (ref 0.0–1.2)
BUN / CREAT RATIO: 18 (ref 12–28)
BUN: 15 mg/dL (ref 8–27)
CALCIUM: 8.9 mg/dL (ref 8.7–10.3)
CO2: 24 mmol/L (ref 20–29)
CREATININE: 0.84 mg/dL (ref 0.57–1.00)
Chloride: 105 mmol/L (ref 96–106)
GFR calc non Af Amer: 76 mL/min/{1.73_m2} (ref >59)
GFR, EST AFRICAN AMERICAN: 87 mL/min/{1.73_m2} (ref >59)
Globulin, Total: 3.4 g/dL (ref 1.5–4.5)
Glucose: 110 mg/dL — ABNORMAL HIGH (ref 65–99)
Potassium: 4.1 mmol/L (ref 3.5–5.2)
Sodium: 144 mmol/L (ref 134–144)
TOTAL PROTEIN: 7.3 g/dL (ref 6.0–8.5)

## 2018-04-11 LAB — CBC
Hematocrit: 37.5 % (ref 34.0–46.6)
Hemoglobin: 11.8 g/dL (ref 11.1–15.9)
MCH: 19.9 pg — ABNORMAL LOW (ref 26.6–33.0)
MCHC: 31.5 g/dL (ref 31.5–35.7)
MCV: 63 fL — AB (ref 79–97)
Platelets: 326 10*3/uL (ref 150–450)
RBC: 5.92 x10E6/uL — ABNORMAL HIGH (ref 3.77–5.28)
RDW: 20.1 % — ABNORMAL HIGH (ref 11.7–15.4)
WBC: 5.8 10*3/uL (ref 3.4–10.8)

## 2018-04-11 LAB — LIPID PANEL
Chol/HDL Ratio: 6.1 ratio — ABNORMAL HIGH (ref 0.0–4.4)
Cholesterol, Total: 224 mg/dL — ABNORMAL HIGH (ref 100–199)
HDL: 37 mg/dL — AB (ref >39)
LDL Calculated: 159 mg/dL — ABNORMAL HIGH (ref 0–99)
Triglycerides: 142 mg/dL (ref 0–149)
VLDL CHOLESTEROL CAL: 28 mg/dL (ref 5–40)

## 2018-04-11 LAB — GC/CHLAMYDIA PROBE AMP
Chlamydia trachomatis, NAA: NEGATIVE
NEISSERIA GONORRHOEAE BY PCR: NEGATIVE

## 2018-04-11 LAB — HEMOGLOBIN A1C
ESTIMATED AVERAGE GLUCOSE: 134 mg/dL
Hgb A1c MFr Bld: 6.3 % — ABNORMAL HIGH (ref 4.8–5.6)

## 2018-04-11 LAB — HIV ANTIBODY (ROUTINE TESTING W REFLEX): HIV Screen 4th Generation wRfx: NONREACTIVE

## 2018-04-11 LAB — RPR: RPR Ser Ql: NONREACTIVE

## 2018-04-11 LAB — HEPATITIS C ANTIBODY: HEP C VIRUS AB: 0.2 {s_co_ratio} (ref 0.0–0.9)

## 2018-04-11 LAB — VITAMIN D 25 HYDROXY (VIT D DEFICIENCY, FRACTURES): Vit D, 25-Hydroxy: 15.9 ng/mL — ABNORMAL LOW (ref 30.0–100.0)

## 2018-04-11 MED ORDER — HYDROCHLOROTHIAZIDE 12.5 MG PO CAPS: 25.0000 mg | ORAL_CAPSULE | Freq: Every day | ORAL | 3 refills | Status: DC

## 2018-04-11 MED ORDER — BUSPIRONE HCL 10 MG PO TABS: 10.0000 mg | ORAL_TABLET | Freq: Two times a day (BID) | ORAL | 3 refills | Status: DC

## 2018-04-11 MED ORDER — VITAMIN D 25 MCG (1000 UNIT) PO TABS: 1000.0000 [IU] | ORAL_TABLET | Freq: Every day | ORAL | 3 refills | Status: DC

## 2018-04-11 MED ORDER — CITALOPRAM HYDROBROMIDE 40 MG PO TABS: 40.0000 mg | ORAL_TABLET | Freq: Every day | ORAL | 2 refills | Status: DC

## 2018-04-11 MED ORDER — LOSARTAN POTASSIUM 50 MG PO TABS: 50.0000 mg | ORAL_TABLET | Freq: Every day | ORAL | 3 refills | Status: DC

## 2018-04-11 NOTE — Telephone Encounter (Signed)
Patient called and wants to know what you are going to do about her meds and calling them in to her pharmacy.

## 2018-04-11 NOTE — Addendum Note (Signed)
Addended by: Jac Canavan on: 04/11/2018 11:12 AM   Modules accepted: Orders

## 2018-04-11 NOTE — Telephone Encounter (Signed)
I sent message to her by my chart this morning, I put the recommendations in the message, and have sent refills.   You can see the message at the bottom of the lab results.  Let me know if you can't see the message I sent to her.

## 2018-04-11 NOTE — Telephone Encounter (Signed)
Yes I see it.   Thank you

## 2018-04-15 ENCOUNTER — Telehealth (INDEPENDENT_AMBULATORY_CARE_PROVIDER_SITE_OTHER): Payer: Self-pay | Admitting: *Deleted

## 2018-04-15 NOTE — Telephone Encounter (Signed)
Prescreened pt for COVID 19 and pt answered NO to all questions 

## 2018-04-16 ENCOUNTER — Other Ambulatory Visit: Payer: Self-pay | Admitting: Medical

## 2018-04-16 ENCOUNTER — Telehealth: Payer: Self-pay

## 2018-04-16 ENCOUNTER — Encounter (INDEPENDENT_AMBULATORY_CARE_PROVIDER_SITE_OTHER): Payer: Self-pay | Admitting: Orthopaedic Surgery

## 2018-04-16 ENCOUNTER — Ambulatory Visit (INDEPENDENT_AMBULATORY_CARE_PROVIDER_SITE_OTHER): Payer: No Typology Code available for payment source | Admitting: Orthopaedic Surgery

## 2018-04-16 ENCOUNTER — Ambulatory Visit (INDEPENDENT_AMBULATORY_CARE_PROVIDER_SITE_OTHER): Payer: No Typology Code available for payment source

## 2018-04-16 ENCOUNTER — Other Ambulatory Visit: Payer: Self-pay

## 2018-04-16 VITALS — Ht 65.5 in | Wt 281.0 lb

## 2018-04-16 DIAGNOSIS — M25562 Pain in left knee: Secondary | ICD-10-CM | POA: Diagnosis not present

## 2018-04-16 DIAGNOSIS — G8929 Other chronic pain: Secondary | ICD-10-CM

## 2018-04-16 DIAGNOSIS — M1712 Unilateral primary osteoarthritis, left knee: Secondary | ICD-10-CM | POA: Diagnosis not present

## 2018-04-16 IMAGING — US ULTRASOUND LEFT BREAST LIMITED
1 series · 5 of 5 positions shown · non-contrast
Comparison: Previous exam(s).

CLINICAL DATA: Diffuse bilateral breast pain and left axillary
pain.

EXAM:
2D DIGITAL DIAGNOSTIC BILATERAL MAMMOGRAM WITH CAD AND ADJUNCT TOMO
ULTRASOUND BILATERAL BREAST

[Series 1: ultrasound left breast limited · 0.07mm/px · 5 of 5 slices shown]
[im 1/5]
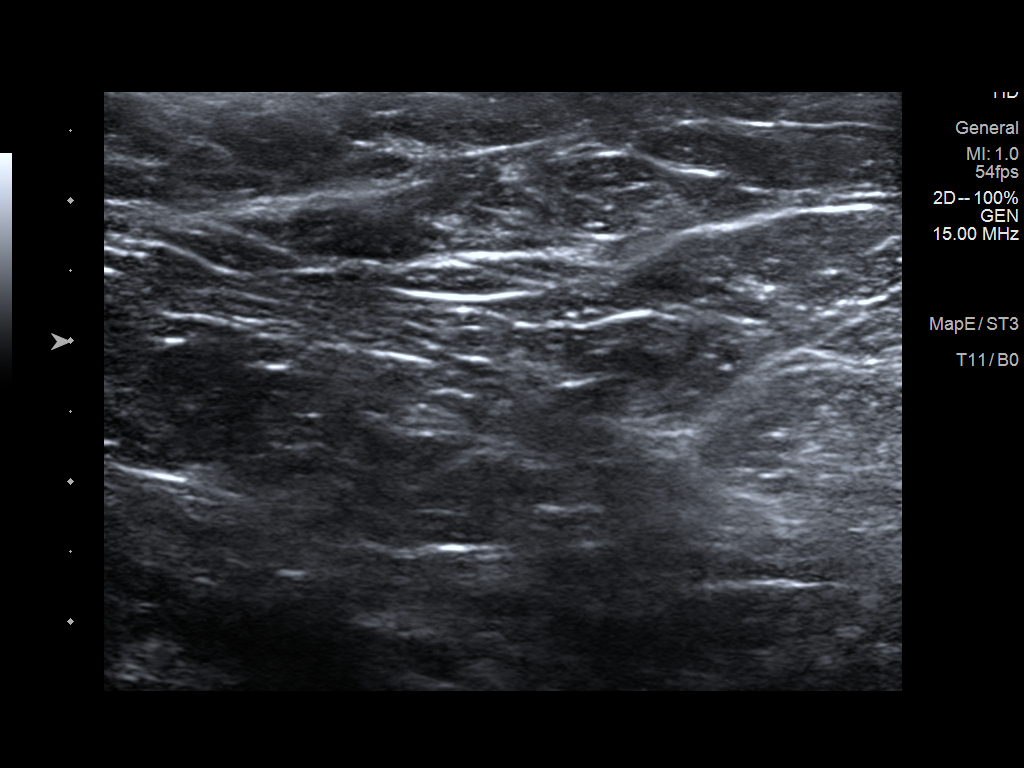
[im 2/5]
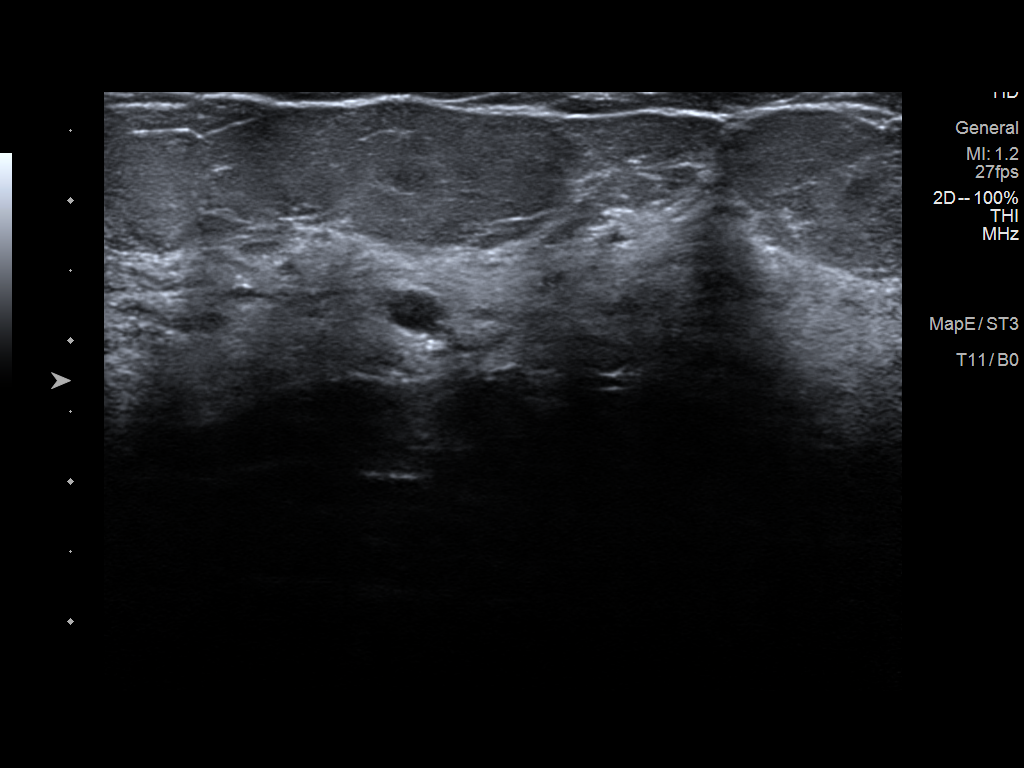
[im 3/5]
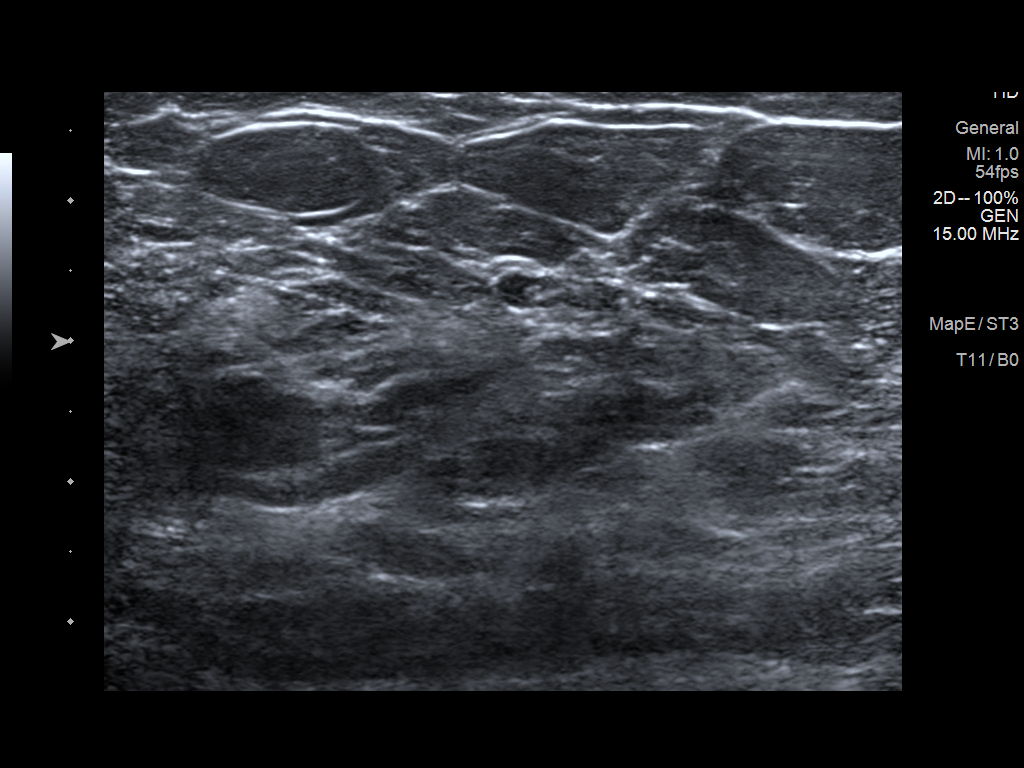
[im 4/5]
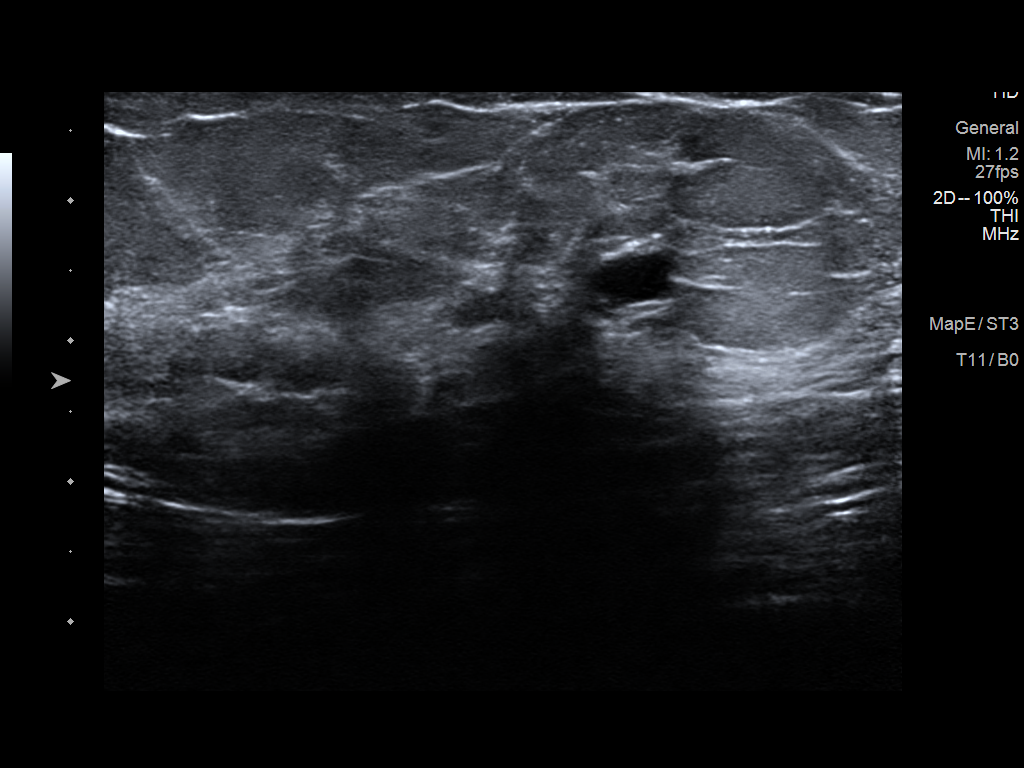
[im 5/5]
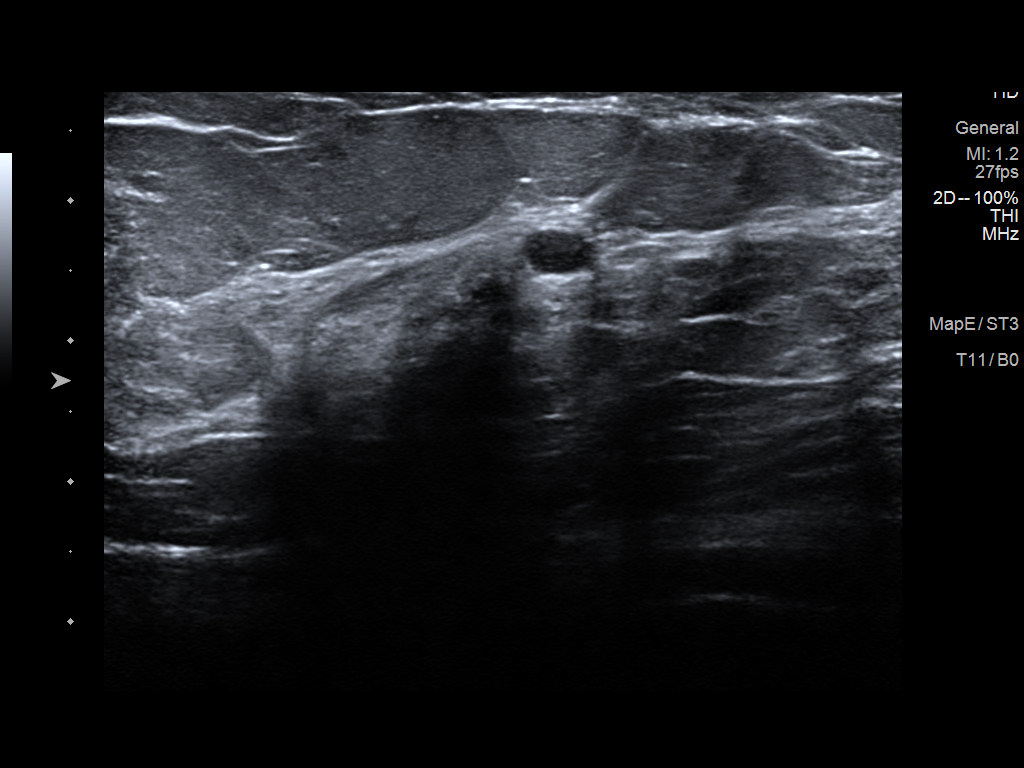

[5 of 5 positions shown; findings below may reference images not displayed]

ACR Breast Density Category c: The breast tissue is heterogeneously
dense, which may obscure small masses.
FINDINGS: There are bilateral masses in the breasts consistent with a benign
etiology such as cysts or fibroadenomas. The largest masses are on
the right with 1 located laterally and another medially. No
suspicious distortion or calcifications.

Mammographic images were processed with CAD.

On physical exam, no suspicious lumps are identified.

Targeted ultrasound is performed, showing numerous cysts are seen in
both breasts accounting for the mammographic findings. Several of
the masses on the left are too small to characterize and could
represent mildly complicated cysts or benign solid masses such as
fibroadenomas. No suspicious masses are identified.
IMPRESSION: No mammographic or sonographic evidence of malignancy.

RECOMMENDATION:
Treatment of the patient's symptoms should be based on clinical and
physical exam given lack of imaging findings. Recommend annual
screening mammography.

I have discussed the findings and recommendations with the patient.
Results were also provided in writing at the conclusion of the
visit. If applicable, a reminder letter will be sent to the patient
regarding the next appointment.

BI-RADS CATEGORY  2: Benign.

## 2018-04-16 MED ORDER — LIDOCAINE HCL 1 % IJ SOLN
3.0000 mL | INTRAMUSCULAR | Status: AC | PRN
  Administered 2018-04-16: 3 mL

## 2018-04-16 MED ORDER — METHYLPREDNISOLONE ACETATE 40 MG/ML IJ SUSP
40.0000 mg | INTRAMUSCULAR | Status: AC | PRN
  Administered 2018-04-16: 40 mg via INTRA_ARTICULAR

## 2018-04-16 MED ORDER — LOSARTAN POTASSIUM 50 MG PO TABS: ORAL_TABLET | ORAL | 1 refills | Status: DC

## 2018-04-16 NOTE — Telephone Encounter (Signed)
Called patient to inquire about whether she was taking Losartan 1 tablet of 1/2 tablet. Patient stated she has a pill cutter and is taking 1/2 tablet. She has already picked up her prescription.

## 2018-04-16 NOTE — Progress Notes (Signed)
Office Visit Note   Patient: Sandra Alvarez           Date of Birth: 1955-02-28           MRN: [id number] Visit Date: 04/16/2018              Requested by: Jac Canavan, PA-C [address] [place], [place] [address] PCP: Jac Canavan, PA-C   Assessment & Plan: Visit Diagnoses:  1. Chronic pain of left knee   2. Unilateral primary osteoarthritis, left knee     Plan: We had a long thorough discussion about weight loss, activity modification, quad strengthening exercises, diet and exercise, taking anti-inflammatories as well as trying a steroid injection in her knee.  She is never had a steroid injection before.  I explained the risk and benefits of these and she tolerated very well.  I do feel that she is a candidate for hyaluronic acid in the future once we are able to order this in light of the coronavirus pandemic.  All question concerns were answered and addressed.  I would like to see her back in 3 months to see how she is doing overall but no x-rays are needed.  I did give her handout on hyaluronic acid as well.  Follow-Up Instructions: Return in about 3 months (around 07/16/2018).   Orders:  Orders Placed This Encounter  Procedures  . Large Joint Inj  . XR Knee 1-2 Views Left   No orders of the defined types were placed in this encounter.     Procedures: Large Joint Inj: L knee on 04/16/2018 9:09 AM Indications: diagnostic evaluation and pain Details: 22 G 1.5 in needle, superolateral approach  Arthrogram: No  Medications: 3 mL lidocaine 1 %; 40 mg methylPREDNISolone acetate 40 MG/ML Outcome: tolerated well, no immediate complications Procedure, treatment alternatives, risks and benefits explained, specific risks discussed. Consent was given by the patient. Immediately prior to procedure a time out was called to verify the correct patient, procedure, equipment, support staff and site/side marked as required. Patient was prepped and draped in the usual sterile  fashion.       Clinical Data: No additional findings.   Subjective: Chief Complaint  Patient presents with  . Left Knee - Pain  The patient is a very pleasant 63 year old female is referred to me from other friends to evaluate and treat significant left knee pain.  She is someone who is morbidly obese with a BMI of 46.  She has had daily left knee pain that is global pain.  Is gotten to where she feels grinding in her knee and it feels unstable to her.  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  She has a lot of stiffness in that right knee.  She is never had any surgery on the knee.  She is never had any type of injections.  She is unaware of any injuries.  She is not a diabetic.  She points to most of the medial joint line source of her pain but it is around all areas of the knee.  She has a hard time going up and down stairs and gets a lot of popping in her knee as well.  HPI  Review of Systems She currently denies any headache, chest pain, shortness of breath, fever, chills, nausea, vomiting  Objective: Vital Signs: Ht 5' 5.5" (1.664 m)   Wt 281 lb (127.5 kg)   BMI 46.05 kg/m   Physical Exam She  is alert and orient x3 and in no acute distress Ortho Exam Examination of her left knee shows varus malalignment.  This is easily correctable.  Range of motion is full.  She has medial lateral joint line tenderness but much more severe on the medial side.  She has significant patellofemoral crepitation.  Her knee is ligamentously stable. Specialty Comments:  No specialty comments available.  Imaging: Xr Knee 1-2 Views Left  Result Date: 04/16/2018 2 views of the left knee show severe end-stage arthritis.  There is varus malalignment.  There is complete loss of medial joint space.  There is a large osteophytes in all 3 compartments.    PMFS History: Patient Active Problem List   Diagnosis Date Noted  . Unilateral primary osteoarthritis, left knee 04/16/2018  . Screen for  STD (sexually transmitted disease) 04/10/2018  . Essential hypertension, benign 04/10/2018  . Hyperlipidemia 04/10/2018  . Polyp of colon 04/10/2018  . S/P hysterectomy 04/10/2018  . Anxiety and depression 04/10/2018  . Thyroid disease 04/10/2018  . Vitamin D deficiency 04/10/2018  . Vaccine counseling 04/10/2018  . History of bronchitis 04/10/2018  . Morbid obesity (HCC) 04/10/2018  . Chronic pain of left knee 04/10/2018  . Family history of osteoporosis 04/10/2018  . Estrogen deficiency 04/10/2018  . Post-menopausal 04/10/2018  . Xanthelasma 04/10/2018   Past Medical History:  Diagnosis Date  . Allergy   . Anxiety   . Depression    prior [facility] at [facility]  . Hyperlipidemia   . Hypertension   . Thyroid disease     Family History  Problem Relation Age of Onset  . Breast cancer Mother   . Cancer Mother   . Glaucoma Mother   . Cancer Father   . Cancer Maternal Grandmother        colon    Past Surgical History:  Procedure Laterality Date  . ABDOMINAL HYSTERECTOMY  1995   due to fibroid uterus, still has ovaries  . COLONOSCOPY  2013   polyps   Social History   Occupational History  . Not on file  Tobacco Use  . Smoking status: Former Games developer  . Smokeless tobacco: Never Used  Substance and Sexual Activity  . Alcohol use: No  . Drug use: No  . Sexual activity: Not on file

## 2018-04-29 ENCOUNTER — Telehealth (INDEPENDENT_AMBULATORY_CARE_PROVIDER_SITE_OTHER): Payer: Self-pay

## 2018-04-29 ENCOUNTER — Ambulatory Visit: Payer: Self-pay | Admitting: Emergency Medicine

## 2018-04-29 NOTE — Telephone Encounter (Signed)
Pt needs handicap sticker form filled out. Says she has the one that is good for 5 yrs and it is about to run out.

## 2018-04-29 NOTE — Telephone Encounter (Signed)
Ok

## 2018-04-29 NOTE — Telephone Encounter (Signed)
That will be fine. 

## 2018-05-13 ENCOUNTER — Encounter

## 2018-05-13 ENCOUNTER — Ambulatory Visit: Payer: Self-pay | Admitting: Emergency Medicine

## 2018-07-02 ENCOUNTER — Telehealth: Payer: Self-pay | Admitting: Gastroenterology

## 2018-07-02 LAB — HM MAMMOGRAPHY

## 2018-07-02 NOTE — Telephone Encounter (Signed)
DOD 07-02-18 AM Dr. Ardis Hughs  Patient is being referred to our office to have a colonoscopy. We received her previous colonoscopy records from 08-2011. Records will be sent for review to DOD. Dr. Ardis Hughs please advise for scheduling.

## 2018-07-03 ENCOUNTER — Telehealth: Payer: Self-pay | Admitting: Medical

## 2018-07-03 NOTE — Telephone Encounter (Signed)
I am happy to report that her mammogram was normal, no worrisome findings.

## 2018-07-03 NOTE — Telephone Encounter (Signed)
Left message for patient. Per Dr. Ardis Hughs, we need pathology report

## 2018-07-04 NOTE — Telephone Encounter (Signed)
Patient notified of mammogram results.

## 2018-07-16 ENCOUNTER — Ambulatory Visit (INDEPENDENT_AMBULATORY_CARE_PROVIDER_SITE_OTHER): Payer: No Typology Code available for payment source | Admitting: Orthopaedic Surgery

## 2018-07-16 ENCOUNTER — Other Ambulatory Visit: Payer: Self-pay

## 2018-07-16 ENCOUNTER — Encounter: Payer: Self-pay | Admitting: Orthopaedic Surgery

## 2018-07-16 DIAGNOSIS — M722 Plantar fascial fibromatosis: Secondary | ICD-10-CM | POA: Diagnosis not present

## 2018-07-16 DIAGNOSIS — M25562 Pain in left knee: Secondary | ICD-10-CM | POA: Diagnosis not present

## 2018-07-16 DIAGNOSIS — G8929 Other chronic pain: Secondary | ICD-10-CM | POA: Diagnosis not present

## 2018-07-16 MED ORDER — METHYLPREDNISOLONE 4 MG PO TABS: ORAL_TABLET | ORAL | 0 refills | Status: DC

## 2018-07-16 NOTE — Progress Notes (Signed)
The patient comes today for follow-up of her left knee having a steroid injection back 3 months ago on that left knee.  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  She is someone who is morbidly obese with a weight of 281 pounds.  Which she is been dealing with more for over a year rate and more recent now is plantar fasciitis about her right foot.  She does have a device that pulls her foot to flex position.  She is trying other modalities as well.  On exam her left knee is doing well.  She has excellent range of motion of this with no tenderness today at all.  Examination of her right foot does show pain over the plantar fascia in the posterior heel but not the Achilles itself.  It is consistent with plantar fasciitis.  I will put her on a 6-day steroid taper and I recommended over-the-counter Voltaren gel.  I can always try a steroid injection if she needs of the right now she is going to try other modalities.  All question concerns were answered addressed.  Follow-up is as needed.

## 2018-08-06 ENCOUNTER — Encounter: Payer: Self-pay | Admitting: Medical

## 2018-08-15 ENCOUNTER — Telehealth: Payer: Self-pay | Admitting: Gastroenterology

## 2018-08-15 NOTE — Telephone Encounter (Signed)
Dr. Ardis Hughs reviewed pt's prev colon and path report from 2013 when a hyperplastic polyp was found.  Pt requested a call back to explain why she is not due for recall colon until 2013.

## 2018-08-15 NOTE — Telephone Encounter (Signed)
Dr. Ardis Hughs has reviewed pt's previous colonoscopy reports.  Recall colon placed 08/2021.

## 2018-08-15 NOTE — Telephone Encounter (Signed)
The pt has a history of cancer in the family and would like an appt to come in and discuss having colonoscopy sooner than 10 years.  No appt available today.  The pt was advised to call back in 1 week and set up appt when the schedule is out.

## 2018-09-06 ENCOUNTER — Other Ambulatory Visit: Payer: Self-pay

## 2018-09-06 ENCOUNTER — Encounter: Payer: No Typology Code available for payment source | Admitting: Medical

## 2018-09-09 ENCOUNTER — Encounter: Payer: No Typology Code available for payment source | Admitting: Medical

## 2018-09-13 ENCOUNTER — Encounter: Payer: Self-pay | Admitting: Medical

## 2018-10-31 ENCOUNTER — Telehealth: Payer: Self-pay | Admitting: Orthopaedic Surgery

## 2018-10-31 MED ORDER — IBUPROFEN 800 MG PO TABS: 800.0000 mg | ORAL_TABLET | Freq: Three times a day (TID) | ORAL | 0 refills | Status: AC | PRN

## 2018-10-31 MED ORDER — IBUPROFEN 800 MG PO TABS: 800.0000 mg | ORAL_TABLET | Freq: Three times a day (TID) | ORAL | 0 refills | Status: DC | PRN

## 2018-10-31 NOTE — Telephone Encounter (Signed)
Ok to fill this. 

## 2018-10-31 NOTE — Telephone Encounter (Signed)
Patient called requesting an RX for an 800mg  Ibuprofen.  Patient uses [facility] on [address]  CB#[phone].  Thank you.

## 2018-11-15 ENCOUNTER — Encounter: Payer: Self-pay | Admitting: Family Medicine

## 2018-11-15 ENCOUNTER — Ambulatory Visit (INDEPENDENT_AMBULATORY_CARE_PROVIDER_SITE_OTHER): Payer: No Typology Code available for payment source | Admitting: Family Medicine

## 2018-11-15 ENCOUNTER — Other Ambulatory Visit: Payer: Self-pay

## 2018-11-15 VITALS — BP 120/60 | HR 81 | Temp 97.6°F | Wt 271.0 lb

## 2018-11-15 DIAGNOSIS — M79671 Pain in right foot: Secondary | ICD-10-CM

## 2018-11-15 DIAGNOSIS — M722 Plantar fascial fibromatosis: Secondary | ICD-10-CM | POA: Diagnosis not present

## 2018-11-15 DIAGNOSIS — M79672 Pain in left foot: Secondary | ICD-10-CM

## 2018-11-15 NOTE — Progress Notes (Signed)
   Subjective:    Patient ID: Sandra Alvarez, female    DOB: February 02, 1955, 63 y.o.   MRN: [id number]  HPI Chief Complaint  Patient presents with  . both feet hurting    had plantar fascitis for over year- flare up.    She is here with complaints of bilateral foot pain for the past 2 days. States she has had ongoing intermittent flare ups of plantar fascitis for months. Pain is predominantly in the heels. States this feels like one of her plantar fascitis flare ups but worse. Pain is worse in the mornings and in the evenings.  States she used drops of CBC oil yesterday evening and she has not had pain since.   States she is seeing a reflexologist, he has not worked on her feet.   States she wears only supportive shoes now.  Reports doing wall stretches and taking ibuprofen 800 mg only once. No numbness, tingling or weakness.  No falls.   Denies fever, chills, dizziness, chest pain, palpitations, shortness of breath, abdominal pain, N/V/D, urinary symptoms, LE edema.    She requests that I refill all of her medications and increase her citalopram. Dorothea Ogle, PA is her PCP.   Reviewed allergies, medications, past medical, surgical, family, and social history.   Review of Systems Pertinent positives and negatives in the history of present illness.     Objective:   Physical Exam BP 120/60   Pulse 81   Temp 97.6 F (36.4 C)   Wt 271 lb (122.9 kg)   BMI 44.41 kg/m    Alert and oriented and in no acute distress. Bilateral LE are neurovascularly intact with good sensation, ROM and strength. Right foot is nontender to palpation. Left foot with mild TTP to heel and pain reproduced with flexion of the toes against resistance.      Assessment & Plan:  Bilateral foot pain - Plan: Ambulatory referral to Podiatry  Plantar fasciitis of left foot - Plan: Ambulatory referral to Podiatry  Suspect plantar fascitis although today she does not have any significant pain or abnormalities  on exam.  Counseling on conservative treatment for plantar fascitis. She is using CBD oil and I did not encourage the use of this. Per request, I will refer her to podiatry. She is aware that she should contact her PCP for medication refills and adjustments.

## 2018-11-15 NOTE — Patient Instructions (Signed)
I recommend that you use an ice water bottle for massage as demonstrated.  Make sure you are wearing supportive shoes at all times when walking. You may take ibuprofen 800 mg three times per day as needed.  I am referring you to [facility] and they will call you to schedule a visit.

## 2018-12-04 ENCOUNTER — Ambulatory Visit (INDEPENDENT_AMBULATORY_CARE_PROVIDER_SITE_OTHER): Payer: No Typology Code available for payment source | Admitting: Medical

## 2018-12-04 ENCOUNTER — Other Ambulatory Visit: Payer: Self-pay

## 2018-12-04 ENCOUNTER — Encounter: Payer: Self-pay | Admitting: Medical

## 2018-12-04 VITALS — Temp 97.1°F | Ht 67.0 in | Wt 265.0 lb

## 2018-12-04 DIAGNOSIS — J3489 Other specified disorders of nose and nasal sinuses: Secondary | ICD-10-CM

## 2018-12-04 DIAGNOSIS — R030 Elevated blood-pressure reading, without diagnosis of hypertension: Secondary | ICD-10-CM

## 2018-12-04 DIAGNOSIS — R0989 Other specified symptoms and signs involving the circulatory and respiratory systems: Secondary | ICD-10-CM | POA: Diagnosis not present

## 2018-12-04 DIAGNOSIS — L989 Disorder of the skin and subcutaneous tissue, unspecified: Secondary | ICD-10-CM | POA: Diagnosis not present

## 2018-12-04 DIAGNOSIS — I10 Essential (primary) hypertension: Secondary | ICD-10-CM

## 2018-12-04 NOTE — Progress Notes (Signed)
Subjective:     Patient ID: Sandra Alvarez, female   DOB: [birth year], 63 y.o.   MRN: [id number]  This visit type was conducted due to national recommendations for restrictions regarding the COVID-19 Pandemic (e.g. social distancing) in an effort to limit this patient's exposure and mitigate transmission in our community.  Due to their co-morbid illnesses, this patient is at least at moderate risk for complications without adequate follow up.  This format is felt to be most appropriate for this patient at this time.    Documentation for virtual audio and video telecommunications through Zoom encounter:  The patient was located at home. The provider was located in the office. The patient did consent to this visit and is aware of possible charges through their insurance for this visit.  The other persons participating in this telemedicine service were none. Time spent on call was 25 minutes and in review of previous records >25 minutes total.  This virtual service is not related to other E/M service within previous 7 days.   HPI Chief Complaint  Patient presents with  . Sinus Problem    drainage, sneezing, congestion   Virtual consult today.   4 days ago awoke with sneezing, nose stopped up, runny nose, lasted all day.  The next morning it continued and was miserabley with all the congestion.  Began coricidin that day.   Yesterday felt kind of bad.  Has had temp up to 99, but 97.1 today.   No body aches.  Has had sinus problems like this for years.   In the past this tends to turn into sinus infections and she normally ends up on antibiotic.  No sore throat.  No cough, no SOB.  No chills, no NV.  No loose stools.   No change in taste or smell.  No sick contacts.  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   Hasn't been wear masks in the house.   The elderly folks sons come over on weekends for dinner, so there has been people in the house.  BP is up today, 190/118.     Is attributing this to the cold medication.   She doesn't check BP regularly.  Compliant with BP medications.    She also notes crusted skin lesion on scalp for over a month.  She used some antifungal cream OTC which seems to be helping.   No other aggravating or relieving factors. No other complaint.  Past Medical History:  Diagnosis Date  . Allergy   . Anxiety   . Depression    prior therapy at [facility]  . Hyperlipidemia   . Hypertension   . Thyroid disease    Current Outpatient Medications on File Prior to Visit  Medication Sig Dispense Refill  . busPIRone (BUSPAR) 10 MG tablet Take 1 tablet (10 mg total) by mouth 2 (two) times daily. 60 tablet 3  . cholecalciferol (VITAMIN D3) 25 MCG (1000 UT) tablet Take 1 tablet (1,000 Units total) by mouth daily. 90 tablet 3  . citalopram (CELEXA) 40 MG tablet Take 1 tablet (40 mg total) by mouth daily. 30 tablet 2  . hydrochlorothiazide (MICROZIDE) 12.5 MG capsule Take 2 capsules (25 mg total) by mouth daily. 90 capsule 3  . ibuprofen (ADVIL) 800 MG tablet Take 1 tablet (800 mg total) by mouth every 8 (eight) hours as needed. 90 tablet 0  . losartan (COZAAR) 50 MG tablet 1/2 tablet daily 45 tablet 1  .  methylPREDNISolone (MEDROL) 4 MG tablet Medrol dose pack. Take as instructed (Patient not taking: Reported on 11/15/2018) 21 tablet 0   No current facility-administered medications on file prior to visit.     Review of Systems As in subjective      Objective:   Physical Exam Due to coronavirus pandemic stay at home measures, patient visit was virtual and they were not examined in person.   Sounded somewhat stuffy in head on the phone, but not dyspneic  Temp (!) 97.1 F (36.2 C)   Ht 5\' 7"  (1.702 m)   Wt 265 lb (120.2 kg)   BMI 41.50 kg/m   BP Readings from Last 3 Encounters:  11/15/18 120/60  04/10/18 124/80  09/16/15 122/84   Home BP reading today 190/118 with wrist cuff? Per patient     Assessment:     Encounter Diagnoses   Name Primary?  . Sinus pressure Yes  . Runny nose   . Skin lesion   . Elevated blood pressure reading   . Essential hypertension, benign        Plan:     We discussed her symptoms including sinus pressure, runny nose.  I asked her to go get a Covid test particular since she is a caregiver 2 elderly folks.  She is a live-in caregiver.  One of them has had a cough and runny nose as well.  They will all 3 go get tested at the [facility] test site.  I advise rest, hydration, nasal saline rinse, can try Afrin nasal spray OTC short-term for 3 days or less up to twice daily, can try either Benadryl at bedtime or Mucinex plain during the day to help with symptoms.  Advise she either get these to the [facility] or have someone deliver them to minimize contact with the general public.  I advise she wear a mask and that the other people in the house wear a mask as well.  I advised that the other family members that come in on the weekends (10 people) should either be wearing mask or possibly cut down on those interactions for now.  Particularly the residents son who is their POA seems to be the one she is most concerned about as he eats out at restaurants frequently and is less careful with mask wearing and social distancing.  Self Quarantine: The CDC, Centers for Disease Control has recommended a self quarantine of 10 days from the start of your illness until you are symptom-free including at least 24 hours of no symptoms including no fever, no shortness of breath, and no body aches and chills, by day 10 before returning to work or general contact with the public.  What does self quarantine mean: avoiding contact with people as much as possible.   Particularly in your house, isolate your self from others in a separate room, wear a mask when possible in the room, particularly if coughing a lot.   Have others bring food, water, medications, etc., to your door, but avoid direct  contact with your household contacts during this time to avoid spreading the infection to them.   If you have a separate bathroom and living quarters during the next 2 weeks away from others, that would be preferable.    If you can't completely isolate, then wear a mask, wash hands frequently with soap and water for at least 15 seconds, minimize close contact with others, and have a friend or family member check regularly from a distance  to make sure you are not getting seriously worse.     You should not be going out in public, should not be going to stores, to work or other public places until all your symptoms have resolved and at least 10 days + 24 hours of no symptoms at all have transpired.   Ideally you should avoid contact with others for a full 10 days if possible.  One of the goals is to limit spread to high risk people; people that are older and elderly, people with multiple health issues like diabetes, heart disease, lung disease, and anybody that has weakened immune systems such as people with cancer or on immunosuppressive therapy.  Hypertension-she will monitor her blood pressure through tomorrow and then call me back with readings.  She attributes the blood pressure changes to her medication over-the-counter.  She will stop the Coricidin HBP version that she is taking and use the recommendations above.  We discussed symptoms of Covid that would prompt urgent call back or recheck in the emergency department.  Skin lesion-likely fungal the way she is describing this.  She will send me pictures to my chart.  Tahjae was seen today for sinus problem.  Diagnoses and all orders for this visit:  Sinus pressure -     Novel Coronavirus, NAA ([facility])  Runny nose -     Novel Coronavirus, NAA ([facility])  Skin lesion  Elevated blood pressure reading  Essential hypertension, benign

## 2018-12-05 ENCOUNTER — Telehealth: Payer: Self-pay | Admitting: Medical

## 2018-12-05 NOTE — Telephone Encounter (Signed)
Pt called and said head feels funny, no better, headache, sinuses, wants antibiotic called in [facility].  Tried OTC & no better

## 2018-12-06 ENCOUNTER — Other Ambulatory Visit: Payer: Self-pay | Admitting: Medical

## 2018-12-06 MED ORDER — AMOXICILLIN 875 MG PO TABS: 875.0000 mg | ORAL_TABLET | Freq: Two times a day (BID) | ORAL | 0 refills | Status: DC

## 2018-12-06 MED ORDER — HYDROCODONE-HOMATROPINE 5-1.5 MG/5ML PO SYRP: 5.0000 mL | ORAL_SOLUTION | Freq: Three times a day (TID) | ORAL | 0 refills | Status: AC | PRN

## 2018-12-06 NOTE — Telephone Encounter (Signed)
lmom for patient to call back for message from provider  

## 2018-12-06 NOTE — Telephone Encounter (Signed)
I sent amoxicillin antibiotic.  Does she need anything for nausea, cough?    Make sure she is continue to hydrate well.  Her covid test result is still not back.  Has she heard any results on the 2 others in the house on their covid tests?  I assume they got tested

## 2018-12-06 NOTE — Telephone Encounter (Signed)
Patient wants something for cough. She has not been tested for COVID since she was told it would be 2 hours before she could get tested. 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

## 2018-12-06 NOTE — Telephone Encounter (Signed)
I sent a cough syrup.  I do recommend she get covid tested.   [facility] [employer] [facility] or call [facility]pt for other locations.  I have been waiting on a covid result all week as I thought she was tested Monday.

## 2018-12-06 NOTE — Telephone Encounter (Signed)
Patient informed medication was at the pharmacy. She stated she is currently making arrangement so she can have her COVID test done.

## 2018-12-09 ENCOUNTER — Other Ambulatory Visit: Payer: Self-pay

## 2018-12-09 DIAGNOSIS — Z20822 Contact with and (suspected) exposure to covid-19: Secondary | ICD-10-CM

## 2018-12-09 NOTE — Telephone Encounter (Signed)
done

## 2018-12-11 LAB — NOVEL CORONAVIRUS, NAA: SARS-CoV-2, NAA: NOT DETECTED

## 2018-12-17 ENCOUNTER — Other Ambulatory Visit: Payer: Self-pay

## 2018-12-17 MED ORDER — LOSARTAN POTASSIUM 50 MG PO TABS: ORAL_TABLET | ORAL | 1 refills | Status: DC

## 2018-12-17 MED ORDER — CITALOPRAM HYDROBROMIDE 40 MG PO TABS: 40.0000 mg | ORAL_TABLET | Freq: Every day | ORAL | 2 refills | Status: DC

## 2018-12-17 MED ORDER — HYDROCHLOROTHIAZIDE 12.5 MG PO CAPS: 25.0000 mg | ORAL_CAPSULE | Freq: Every day | ORAL | 0 refills | Status: DC

## 2018-12-17 NOTE — Telephone Encounter (Signed)
[facility] Celexa was sent to provider to refill.

## 2018-12-17 NOTE — Telephone Encounter (Signed)
Pt. Called stating she requested a [facility] on [facility] [address]. Hctz, citalopram, and losartan, pt. Also stated that she sent you some pics on my chart of some dry skin and wanted to know if you could call in some cream for that as well.

## 2018-12-18 ENCOUNTER — Other Ambulatory Visit: Payer: Self-pay | Admitting: Medical

## 2018-12-18 MED ORDER — HYDROCORTISONE VALERATE 0.2 % EX OINT: TOPICAL_OINTMENT | CUTANEOUS | 1 refills | Status: DC

## 2018-12-18 NOTE — Telephone Encounter (Signed)
Patient will call to schedule appointment.

## 2018-12-18 NOTE — Telephone Encounter (Signed)
I sent westcort/hydrocortisone ointment for the rash she sent pictures of.   Please schedule med check visit in 3-4 weeks as follow up to issues from 03/2018 physical and rash.

## 2019-01-20 ENCOUNTER — Telehealth: Payer: Self-pay | Admitting: Medical

## 2019-01-20 ENCOUNTER — Encounter: Payer: Self-pay | Admitting: Medical

## 2019-01-20 ENCOUNTER — Other Ambulatory Visit: Payer: Self-pay

## 2019-01-20 ENCOUNTER — Ambulatory Visit (INDEPENDENT_AMBULATORY_CARE_PROVIDER_SITE_OTHER): Payer: PRIVATE HEALTH INSURANCE | Admitting: Medical

## 2019-01-20 VITALS — BP 156/92 | HR 63 | Temp 98.4°F | Ht 67.0 in | Wt 275.0 lb

## 2019-01-20 DIAGNOSIS — K921 Melena: Secondary | ICD-10-CM | POA: Diagnosis not present

## 2019-01-20 DIAGNOSIS — K529 Noninfective gastroenteritis and colitis, unspecified: Secondary | ICD-10-CM | POA: Diagnosis not present

## 2019-01-20 DIAGNOSIS — U071 COVID-19: Secondary | ICD-10-CM

## 2019-01-20 DIAGNOSIS — Z800 Family history of malignant neoplasm of digestive organs: Secondary | ICD-10-CM

## 2019-01-20 DIAGNOSIS — I10 Essential (primary) hypertension: Secondary | ICD-10-CM

## 2019-01-20 DIAGNOSIS — R0600 Dyspnea, unspecified: Secondary | ICD-10-CM

## 2019-01-20 DIAGNOSIS — M722 Plantar fascial fibromatosis: Secondary | ICD-10-CM

## 2019-01-20 DIAGNOSIS — R21 Rash and other nonspecific skin eruption: Secondary | ICD-10-CM

## 2019-01-20 DIAGNOSIS — R109 Unspecified abdominal pain: Secondary | ICD-10-CM | POA: Insufficient documentation

## 2019-01-20 DIAGNOSIS — R238 Other skin changes: Secondary | ICD-10-CM

## 2019-01-20 DIAGNOSIS — R430 Anosmia: Secondary | ICD-10-CM

## 2019-01-20 DIAGNOSIS — K635 Polyp of colon: Secondary | ICD-10-CM

## 2019-01-20 MED ORDER — TERBINAFINE HCL 1 % EX CREA: 1.0000 "application " | TOPICAL_CREAM | Freq: Two times a day (BID) | CUTANEOUS | 0 refills | Status: AC

## 2019-01-20 MED ORDER — EUCRISA 2 % EX OINT: 1.0000 "application " | TOPICAL_OINTMENT | Freq: Two times a day (BID) | CUTANEOUS | 2 refills | Status: DC

## 2019-01-20 MED ORDER — ALBUTEROL SULFATE HFA 108 (90 BASE) MCG/ACT IN AERS: 2.0000 | INHALATION_SPRAY | Freq: Four times a day (QID) | RESPIRATORY_TRACT | 0 refills | Status: DC | PRN

## 2019-01-20 NOTE — Telephone Encounter (Signed)
She will need to referrals.  Refer to podiatry, just not tried foot and ankle as apparently they are not preferred on her insurance.   We referred apparently in September or October for the same.  She will need a referral to gastroenterology here in [place].  She is going to check with her insurance to see who is in her network.

## 2019-01-20 NOTE — Telephone Encounter (Signed)
The message should say she needs 2 referrals, not "to"

## 2019-01-20 NOTE — Patient Instructions (Signed)
Skin issues:  Scalp lesion could be fungal - begin Lamisil cream which is by prescription or over the counter.  Use this cream for 2-3 weeks.   If not resolving, then let me know or recheck  Skin rash of hands and ears - begin Eucrisa eczema prescription ointment twice daily.   If not much improved within 2 weeks, then let me know or recheck.   Blood in stool, abdominal pain    We will check some labs today  We will refer for updated GI evaluation and colonoscopy   Options include the following:  [facility] such as Dr. Rhea Belton or Dr. Marina Goodell or Dr. Myrtie Neither  Another option is Dr. Charna Elizabeth or Dr. Jeani Hawking  Another option is [facility], such as Dr. Dulce Sellar   Hypertension   Continue your blood pressure medication daily  Check you blood pressures 1-2 times per week  Goal is 120/70.  We need to at least be less 130/80

## 2019-01-20 NOTE — Telephone Encounter (Signed)
done

## 2019-01-20 NOTE — Progress Notes (Signed)
Subjective: Chief Complaint  Patient presents with  . Blood In Stools    only when wiping with abdominal pain   . Eczema    noth hands and head area    Here for numerous concerns.   She notes some blood on toilet paper.   Has seen some blood on toilet paper a few times per week for a few months.   Is bright red on tp only.   Not mixed in stool or in the bowl.   No rectal itching or pain.  Has BM about 3-4 times per day.  Doesn't feel hemorrhoids and no pain with wiping.     HTN - compliant with medicaiton but not checking BPs.   Attributes the BP issues to stress today, frustrated on the phone before this appt.    She c/o rash on dorsal hands and spot on right scalp.  Similar rash in ears.  Rash on scalp cleared up with antifungal cream at one point but then it came back .  Is using neosporin on hands and ears and it doesn't help.    No particular triggers.  Been dealing with these rash for months.    Wants podiatrist referral.  Prior specialist was not in her network, needs new referral.  Has plantar fascitis.   Saw Harland Dingwall, NP here in 10/2018 for same.    Sometimes has to clear throat, feels like breathing isn't as good as it should be.   Feels like chest is not clear in past 4-5 months.   Nonsmoker, not around a lot of allergens.    She had covid a few months ago but was already having those problems.  No hx/o asthma, but feels she has some chronic issues.   Has grandson's inhaler she is using.   Wants her on.      Past Medical History:  Diagnosis Date  . Allergy   . Anxiety   . Depression    prior therapy at [facility]  . Hyperlipidemia   . Hypertension   . Thyroid disease     Past Surgical History:  Procedure Laterality Date  . ABDOMINAL HYSTERECTOMY  1995   due to fibroid uterus, still has ovaries  . COLONOSCOPY  2013   polyps   Current Outpatient Medications on File Prior to Visit  Medication Sig Dispense Refill  . busPIRone (BUSPAR) 10 MG tablet Take 1 tablet (10 mg  total) by mouth 2 (two) times daily. 60 tablet 3  . cholecalciferol (VITAMIN D3) 25 MCG (1000 UT) tablet Take 1 tablet (1,000 Units total) by mouth daily. 90 tablet 3  . citalopram (CELEXA) 40 MG tablet Take 1 tablet (40 mg total) by mouth daily. 30 tablet 2  . hydrochlorothiazide (MICROZIDE) 12.5 MG capsule Take 2 capsules (25 mg total) by mouth daily. 90 capsule 0  . ibuprofen (ADVIL) 800 MG tablet Take 1 tablet (800 mg total) by mouth every 8 (eight) hours as needed. 90 tablet 0  . losartan (COZAAR) 50 MG tablet 1/2 tablet daily 45 tablet 1  . amoxicillin (AMOXIL) 875 MG tablet Take 1 tablet (875 mg total) by mouth 2 (two) times daily. (Patient not taking: Reported on 01/20/2019) 20 tablet 0  . hydrocortisone valerate ointment (WESTCORT) 0.2 % Apply to affected area daily (Patient not taking: Reported on 01/20/2019) 45 g 1   No current facility-administered medications on file prior to visit.   Family History  Problem Relation Age of Onset  . Breast cancer Mother   .  Cancer Mother        multiple myeloma  . Glaucoma Mother   . Cancer Father        stomach and liver  . Cancer Maternal Grandmother        colon      Objective: BP (!) 156/92   Pulse 63   Temp 98.4 F (36.9 C)   Ht 5' 7" (1.702 m)   Wt 275 lb (124.7 kg)   SpO2 98%   BMI 43.07 kg/m   Wt Readings from Last 3 Encounters:  01/20/19 275 lb (124.7 kg)  12/04/18 265 lb (120.2 kg)  11/15/18 271 lb (122.9 kg)   BP Readings from Last 3 Encounters:  01/20/19 (!) 156/92  11/15/18 120/60  04/10/18 124/80   Gen: wd, wn ,nad, obese AA female Skin: pink red irritated skin of bilat ear pinna at meatus, rough patches of skin on bilat hands dorsally over MCPs on a few fingers.    Posterior parietal right scalp with irritated rough pink skin approx 1.5 cm diameter.   No crusting, no hair loss.  No fluctuance or drainage Lungs clear Heart rrr, normal s1, s2, no murmurs Ext: no edema Abdomen: +bs, soft, nontender, no mass, no  organomegaly DRE small external hemorrhoid, palpable internal hemorrhoids, negative occult stool, there is also some pinkish coloration of skin down the gluteal cleft suggestive of fungal infection.  Exam chaperoned by nurse.    Assessment: Encounter Diagnoses  Name Primary?  . Abdominal discomfort Yes  . Blood in stool   . Frequent stools   . Family history of colon cancer   . Loss of smell   . COVID-19   . Rash   . Scalp irritation   . Essential hypertension, benign   . Polyp of colon, unspecified part of colon, unspecified type   . Plantar fascia syndrome   . Dyspnea, unspecified type      Plan: Abdominal discomfort, blood in stool - labs today.  I suspect the issue is internal hemorrhoids that are bleeding and probably irritated by frequent stooling, but she is past due for colonoscopy so we will go ahead and refer for that as well.  Hx/o colon polyp and family history of colon cancer - referral to GI in [place].  Reviewed 2013 colonoscopy report from [facility]. She wants to switch to [facility] provider  Loss of smell, s/t covid infection a few months ago - still hasn't regained smell sense.  Advised that it could take time and no clear therapy for this  Rash/eczema of hands and ears vs psoriasis - begin trial of Eucrisa.  If not improving in 2-3 weeks, recheck  Scalp irritation rash - could be fungal vs seborrheic dermatitis .  Begin Lamisil cream trial x 2- 3 weeks, then recheck.  Also advised use Lamisil practicing good hygiene to the gluteal cleft region as well given the findings on exam  HTN - elevated BP today likely due to stress.  C/t current medication, advised home BP monitoring.  Recheck in 3 weeks  Plantar fascitis - new referral to podiatry as 10/2018 referring provider not in her network  Dyspnea  - lungs clear.  She wants to be tested for asthma .  Unfortunately our PFT device is not working today.  I will give her a trial of inhaler she can use over  the next 2 weeks to see if she notices any difference.    Renea was seen today for blood in stools and eczema.  Diagnoses and all orders for this visit:  Abdominal discomfort -     Comprehensive metabolic panel -     CBC with Differential -     Ambulatory referral to Gastroenterology  Blood in stool -     Comprehensive metabolic panel -     CBC with Differential -     Ambulatory referral to Gastroenterology  Frequent stools -     Ambulatory referral to Gastroenterology  Family history of colon cancer -     Ambulatory referral to Gastroenterology  Loss of smell  COVID-19  Rash  Scalp irritation  Essential hypertension, benign  Polyp of colon, unspecified part of colon, unspecified type -     Ambulatory referral to Gastroenterology  Plantar fascia syndrome -     Ambulatory referral to Podiatry  Dyspnea, unspecified type  Other orders -     terbinafine (LAMISIL AT) 1 % cream; Apply 1 application topically 2 (two) times daily. -     Crisaborole (EUCRISA) 2 % OINT; Apply 1 application topically 2 (two) times daily.

## 2019-01-21 ENCOUNTER — Other Ambulatory Visit: Payer: Self-pay | Admitting: Medical

## 2019-01-21 LAB — COMPREHENSIVE METABOLIC PANEL
ALT: 18 IU/L (ref 0–32)
AST: 17 IU/L (ref 0–40)
Albumin/Globulin Ratio: 1.4 (ref 1.2–2.2)
Albumin: 4.1 g/dL (ref 3.8–4.8)
Alkaline Phosphatase: 108 IU/L (ref 39–117)
BUN/Creatinine Ratio: 19 (ref 12–28)
BUN: 15 mg/dL (ref 8–27)
Bilirubin Total: 0.6 mg/dL (ref 0.0–1.2)
CO2: 26 mmol/L (ref 20–29)
Calcium: 9.2 mg/dL (ref 8.7–10.3)
Chloride: 101 mmol/L (ref 96–106)
Creatinine, Ser: 0.81 mg/dL (ref 0.57–1.00)
GFR calc Af Amer: 91 mL/min/{1.73_m2} (ref >59)
GFR calc non Af Amer: 79 mL/min/{1.73_m2} (ref >59)
Globulin, Total: 3 g/dL (ref 1.5–4.5)
Glucose: 87 mg/dL (ref 65–99)
Potassium: 4.3 mmol/L (ref 3.5–5.2)
Sodium: 139 mmol/L (ref 134–144)
Total Protein: 7.1 g/dL (ref 6.0–8.5)

## 2019-01-21 LAB — CBC WITH DIFFERENTIAL/PLATELET
Basophils Absolute: 0.1 10*3/uL (ref 0.0–0.2)
Basos: 1 %
EOS (ABSOLUTE): 0.1 10*3/uL (ref 0.0–0.4)
Eos: 2 %
Hematocrit: 39.3 % (ref 34.0–46.6)
Hemoglobin: 12 g/dL (ref 11.1–15.9)
Immature Grans (Abs): 0 10*3/uL (ref 0.0–0.1)
Immature Granulocytes: 0 %
Lymphocytes Absolute: 1.5 10*3/uL (ref 0.7–3.1)
Lymphs: 27 %
MCH: 19.9 pg — ABNORMAL LOW (ref 26.6–33.0)
MCHC: 30.5 g/dL — ABNORMAL LOW (ref 31.5–35.7)
MCV: 65 fL — ABNORMAL LOW (ref 79–97)
Monocytes Absolute: 0.5 10*3/uL (ref 0.1–0.9)
Monocytes: 8 %
Neutrophils Absolute: 3.4 10*3/uL (ref 1.4–7.0)
Neutrophils: 62 %
Platelets: 352 10*3/uL (ref 150–450)
RBC: 6.03 x10E6/uL — ABNORMAL HIGH (ref 3.77–5.28)
RDW: 19.2 % — ABNORMAL HIGH (ref 11.7–15.4)
WBC: 5.5 10*3/uL (ref 3.4–10.8)

## 2019-01-21 MED ORDER — HYDROCORTISONE ACETATE 25 MG RE SUPP: 25.0000 mg | Freq: Two times a day (BID) | RECTAL | 1 refills | Status: DC

## 2019-01-26 ENCOUNTER — Other Ambulatory Visit: Payer: Self-pay | Admitting: Medical

## 2019-01-27 ENCOUNTER — Telehealth: Payer: Self-pay

## 2019-01-27 NOTE — Telephone Encounter (Signed)
Per our conversation verbally the 20 mg dose is fine

## 2019-01-27 NOTE — Telephone Encounter (Signed)
The pharmacy called stating that they could not make the suppository at 25mg  since you can get that commercially  They could do either 20mg  or 30 mg per can do 20 mg.

## 2019-02-18 ENCOUNTER — Ambulatory Visit: Payer: Self-pay | Admitting: Gastroenterology

## 2019-02-25 ENCOUNTER — Other Ambulatory Visit: Payer: Self-pay | Admitting: Medical

## 2019-02-28 ENCOUNTER — Other Ambulatory Visit: Payer: Self-pay | Admitting: Medical

## 2019-03-07 ENCOUNTER — Ambulatory Visit (INDEPENDENT_AMBULATORY_CARE_PROVIDER_SITE_OTHER): Payer: PRIVATE HEALTH INSURANCE | Admitting: Medical

## 2019-03-07 ENCOUNTER — Other Ambulatory Visit: Payer: Self-pay

## 2019-03-07 ENCOUNTER — Encounter: Payer: Self-pay | Admitting: Medical

## 2019-03-07 VITALS — BP 110/70 | HR 71 | Temp 98.2°F | Ht 67.0 in | Wt 271.6 lb

## 2019-03-07 DIAGNOSIS — M1712 Unilateral primary osteoarthritis, left knee: Secondary | ICD-10-CM

## 2019-03-07 DIAGNOSIS — Z7189 Other specified counseling: Secondary | ICD-10-CM

## 2019-03-07 DIAGNOSIS — K529 Noninfective gastroenteritis and colitis, unspecified: Secondary | ICD-10-CM

## 2019-03-07 DIAGNOSIS — Z0000 Encounter for general adult medical examination without abnormal findings: Secondary | ICD-10-CM | POA: Diagnosis not present

## 2019-03-07 DIAGNOSIS — I10 Essential (primary) hypertension: Secondary | ICD-10-CM | POA: Diagnosis not present

## 2019-03-07 DIAGNOSIS — E785 Hyperlipidemia, unspecified: Secondary | ICD-10-CM

## 2019-03-07 DIAGNOSIS — Z807 Family history of other malignant neoplasms of lymphoid, hematopoietic and related tissues: Secondary | ICD-10-CM

## 2019-03-07 DIAGNOSIS — Z8709 Personal history of other diseases of the respiratory system: Secondary | ICD-10-CM

## 2019-03-07 DIAGNOSIS — R718 Other abnormality of red blood cells: Secondary | ICD-10-CM | POA: Insufficient documentation

## 2019-03-07 DIAGNOSIS — E079 Disorder of thyroid, unspecified: Secondary | ICD-10-CM | POA: Diagnosis not present

## 2019-03-07 DIAGNOSIS — Z1211 Encounter for screening for malignant neoplasm of colon: Secondary | ICD-10-CM

## 2019-03-07 DIAGNOSIS — K635 Polyp of colon: Secondary | ICD-10-CM

## 2019-03-07 DIAGNOSIS — G8929 Other chronic pain: Secondary | ICD-10-CM

## 2019-03-07 DIAGNOSIS — M25562 Pain in left knee: Secondary | ICD-10-CM

## 2019-03-07 DIAGNOSIS — F419 Anxiety disorder, unspecified: Secondary | ICD-10-CM

## 2019-03-07 DIAGNOSIS — F329 Major depressive disorder, single episode, unspecified: Secondary | ICD-10-CM

## 2019-03-07 DIAGNOSIS — Z800 Family history of malignant neoplasm of digestive organs: Secondary | ICD-10-CM

## 2019-03-07 DIAGNOSIS — E559 Vitamin D deficiency, unspecified: Secondary | ICD-10-CM | POA: Diagnosis not present

## 2019-03-07 DIAGNOSIS — Z8262 Family history of osteoporosis: Secondary | ICD-10-CM

## 2019-03-07 DIAGNOSIS — Z90710 Acquired absence of both cervix and uterus: Secondary | ICD-10-CM

## 2019-03-07 DIAGNOSIS — Z7185 Encounter for immunization safety counseling: Secondary | ICD-10-CM

## 2019-03-07 DIAGNOSIS — Z780 Asymptomatic menopausal state: Secondary | ICD-10-CM

## 2019-03-07 DIAGNOSIS — Z136 Encounter for screening for cardiovascular disorders: Secondary | ICD-10-CM

## 2019-03-07 DIAGNOSIS — M722 Plantar fascial fibromatosis: Secondary | ICD-10-CM

## 2019-03-07 DIAGNOSIS — R7301 Impaired fasting glucose: Secondary | ICD-10-CM | POA: Insufficient documentation

## 2019-03-07 DIAGNOSIS — H0260 Xanthelasma of unspecified eye, unspecified eyelid: Secondary | ICD-10-CM

## 2019-03-07 DIAGNOSIS — E2839 Other primary ovarian failure: Secondary | ICD-10-CM

## 2019-03-07 DIAGNOSIS — Z1231 Encounter for screening mammogram for malignant neoplasm of breast: Secondary | ICD-10-CM

## 2019-03-07 NOTE — Progress Notes (Addendum)
Subjective:  Sandra Alvarez is a 64 y.o. female who presents for well visit, concerns. Chief Complaint  Patient presents with  . Medication Management  . Annual Exam     Here for med check today and checkup.  HTN - compliant with Losartan '50mg'$ , 1/2 tablet daily, Hydrochlorothiazide 12.'5mg'$ , 2 tablets daily.  Vit d deficiency - compliant with 1000u vit d daily.  Impaired glucose -  Here for repeat labs today.  History of elevated lipids -not currently on medication, has had memory fog with prior statin.   Hx/o depression and anxiety, compliant with medicaiton  Has ongoing foot pain, hx/o plantar fascitis.  Has some puffy areas of both feet she wants looked at.  Had plantar fascitis but this has improved.  Has pain more along outside edge of foot now and tightness in heel and back of leg, achilles.  No injury, no fall.     Former smoker.   Quit 25 years ago.  Smoked for years, but it was sporadic.  Would smoke maybe 1 pack per month.   Past Medical History:  Diagnosis Date  . Allergy   . Anxiety   . Depression    prior therapy at [facility]  . Hyperlipidemia   . Hypertension   . Thyroid disease     Past Surgical History:  Procedure Laterality Date  . ABDOMINAL HYSTERECTOMY  1995   due to fibroid uterus, still has ovaries  . COLONOSCOPY  2013   polyps    Social History   Socioeconomic History  . Marital status: Single    Spouse name: Not on file  . Number of children: Not on file  . Years of education: Not on file  . Highest education level: Not on file  Occupational History  . Not on file  Tobacco Use  . Smoking status: Former Research scientist (life sciences)  . Smokeless tobacco: Never Used  Substance and Sexual Activity  . Alcohol use: No  . Drug use: No  . Sexual activity: Not on file  Other Topics Concern  . Not on file  Social History Narrative   Lives alone.  Was homeless back in 2000 for short period of time.   Is a caregiver.  Works Multimedia programmer as well.  Exercise - not a whole  lot.    03/2018.   Social Determinants of Health   Financial Resource Strain:   . Difficulty of Paying Living Expenses: Not on file  Food Insecurity:   . Worried About Charity fundraiser in the Last Year: Not on file  . Ran Out of Food in the Last Year: Not on file  Transportation Needs:   . Lack of Transportation (Medical): Not on file  . Lack of Transportation (Non-Medical): Not on file  Physical Activity:   . Days of Exercise per Week: Not on file  . Minutes of Exercise per Session: Not on file  Stress:   . Feeling of Stress : Not on file  Social Connections:   . Frequency of Communication with Friends and Family: Not on file  . Frequency of Social Gatherings with Friends and Family: Not on file  . Attends Religious Services: Not on file  . Active Member of Clubs or Organizations: Not on file  . Attends Archivist Meetings: Not on file  . Marital Status: Not on file  Intimate Partner Violence:   . Fear of Current or Ex-Partner: Not on file  . Emotionally Abused: Not on file  . Physically Abused: Not  on file  . Sexually Abused: Not on file    Family History  Problem Relation Age of Onset  . Breast cancer Mother   . Cancer Mother        multiple myeloma  . Glaucoma Mother   . Cancer Father        stomach and liver  . Cancer Maternal Grandmother        colon     Current Outpatient Medications:  .  albuterol (VENTOLIN HFA) 108 (90 Base) MCG/ACT inhaler, TAKE 2 PUFFS BY MOUTH EVERY 6 HOURS AS NEEDED FOR WHEEZE OR SHORTNESS OF BREATH, Disp: 18 g, Rfl: 0 .  busPIRone (BUSPAR) 10 MG tablet, Take 1 tablet (10 mg total) by mouth 2 (two) times daily., Disp: 60 tablet, Rfl: 3 .  cholecalciferol (VITAMIN D3) 25 MCG (1000 UT) tablet, Take 1 tablet (1,000 Units total) by mouth daily., Disp: 90 tablet, Rfl: 3 .  citalopram (CELEXA) 40 MG tablet, Take 1 tablet (40 mg total) by mouth daily., Disp: 30 tablet, Rfl: 2 .  hydrochlorothiazide (MICROZIDE) 12.5 MG capsule, Take 2  capsules (25 mg total) by mouth daily., Disp: 90 capsule, Rfl: 0 .  hydrocortisone (ANUSOL-HC) 25 MG suppository, Place 1 suppository (25 mg total) rectally every 12 (twelve) hours., Disp: 12 suppository, Rfl: 1 .  hydrocortisone valerate ointment (WESTCORT) 0.2 %, Apply to affected area daily, Disp: 45 g, Rfl: 1 .  ibuprofen (ADVIL) 800 MG tablet, Take 1 tablet (800 mg total) by mouth every 8 (eight) hours as needed., Disp: 90 tablet, Rfl: 0 .  losartan (COZAAR) 50 MG tablet, 1/2 tablet daily, Disp: 45 tablet, Rfl: 1 .  terbinafine (LAMISIL AT) 1 % cream, Apply 1 application topically 2 (two) times daily., Disp: 30 g, Rfl: 0 .  Crisaborole (EUCRISA) 2 % OINT, Apply 1 application topically 2 (two) times daily. (Patient not taking: Reported on 03/07/2019), Disp: 100 g, Rfl: 2  Allergies  Allergen Reactions  . Lisinopril     cough  . Simvastatin     Memory fog    The following portions of the patient's history were reviewed and updated as appropriate: allergies, current medications, past family history, past medical history, past social history, past surgical history and problem list.  ROS Otherwise as in subjective above   Review of Systems Constitutional: -fever, -chills, -sweats, -unexpected weight change, -decreased appetite, -fatigue Allergy: -sneezing, -itching, -congestion Dermatology: -changing moles, --rash, -lumps ENT: -runny nose, -ear pain, -sore throat, -hoarseness, -sinus pain, -teeth pain, - ringing in ears, -hearing loss, -nosebleeds Cardiology: +occasional chest pain, -palpitations, -swelling, -difficulty breathing when lying flat, -waking up short of breath Respiratory: -cough, -shortness of breath, -difficulty breathing with exercise or exertion, -wheezing, -coughing up blood Gastroenterology: -abdominal pain, -nausea, -vomiting, -diarrhea, -constipation, -blood in stool, -changes in bowel movement, -difficulty swallowing or eating Hematology: -bleeding, -bruising   Musculoskeletal: -joint aches, +muscle aches, -joint swelling, -back pain, -neck pain, -cramping, -changes in gait Ophthalmology: denies vision changes, eye redness, itching, discharge Urology: -burning with urination, -difficulty urinating, -blood in urine, -urinary frequency, -urgency, -incontinence Neurology: -headache, -weakness, -tingling, -numbness, -memory loss, -falls, -dizziness Psychology: -depressed mood, -agitation, -sleep problems Breast/gyn: -breast tendnerss, -discharge, -lumps, -vaginal discharge,- irregular periods, -heavy periods     Objective:  BP 110/70   Pulse 71   Temp 98.2 F (36.8 C)   Ht '5\' 7"'$  (1.702 m)   Wt 271 lb 9.6 oz (123.2 kg)   SpO2 97%   BMI 42.54 kg/m   General appearance:  alert, no distress, WD/WN, obese African American female Skin: scattered macules, no worrisome lesions Neck: supple, no lymphadenopathy, no thyromegaly, no masses, normal ROM, no bruits Chest: non tender, normal shape and expansion Heart: RRR, normal S1, S2, no murmurs Lungs: CTA bilaterally, no wheezes, rhonchi, or rales Abdomen: +bs, soft, mild epigastric tenderness, otherwise non tender, non distended, no masses, no hepatomegaly, no splenomegaly, no bruits Back: non tender, normal ROM, no scoliosis Musculoskeletal: Bilateral decreased arches of feet, tender on bilateral fifth metatarsal, tender right Achilles inferiorly, mild tenderness of right heel inferiorly, there are 2 somewhat puffy areas of the bilateral anterior ankle suggestive of fat pad, otherwise  upper extremities non tender, no obvious deformity, normal ROM throughout, lower extremities non tender, no obvious deformity, normal ROM throughout Extremities: no edema, no cyanosis, no clubbing Pulses: 2+ symmetric, upper and lower extremities, normal cap refill Neurological: alert, oriented x 3, CN2-12 intact, strength normal upper extremities and lower extremities, sensation normal throughout, DTRs 2+ throughout, no  cerebellar signs, gait normal Psychiatric: normal affect, behavior normal, pleasant  Breast/gyn/rectal - declined  EKG  physical indication.  Rate 65 bpm, PR 162 ms, QRS 94 ms, QTC '4 4 7 '$ ms, -19 axis, normal sinus rhythm, no acute change, abnormal axis  Assessment and Plan :   Encounter Diagnoses  Name Primary?  . Encounter for health maintenance examination in adult Yes  . Essential hypertension, benign   . Thyroid disease   . Hyperlipidemia, unspecified hyperlipidemia type   . Vitamin D deficiency   . Morbid obesity ([place])   . Screening for heart disease   . Encounter for screening mammogram for malignant neoplasm of breast   . Family history of multiple myeloma   . Post-menopausal   . Estrogen deficiency   . Polyp of colon, unspecified part of colon, unspecified type   . Screen for colon cancer   . Unilateral primary osteoarthritis, left knee   . Plantar fascia syndrome   . S/P hysterectomy   . Anxiety and depression   . Vaccine counseling   . History of bronchitis   . Chronic pain of left knee   . Family history of osteoporosis   . Family history of colon cancer   . Frequent stools   . Xanthelasma   . Microcytosis   . Impaired fasting blood sugar     Physical exam - discussed and counseled on healthy lifestyle, diet, exercise, preventative care, vaccinations, sick and well care, proper use of emergency dept and after hours care, and addressed their concerns.    Health screening: Advised they see their eye doctor yearly for routine vision care. Advised they see their dentist yearly for routine dental care including hygiene visits twice yearly.  Bone density - recommended bone density evaluation due to risk factors postmenopausal estrogen deficiency  Cancer screening Counseled on self breast exams, mammograms, cervical cancer screening  Colonoscopy:  Is scheduled for 03/2019  S/p hysterectomy  Vaccinations: Advised yearly influenza vaccine Up to date on  Td Shingles vaccine:  I recommend you have a shingles vaccine to help prevent shingles or herpes zoster outbreak.   Please call your insurer to inquire about coverage for the Shingrix vaccine given in 2 doses.   Some insurers cover this vaccine after age 65, some cover this after age 69.  If your insurer covers this, then call to schedule appointment to have this vaccine here.    Separate significant issues discussed: Plantar fascia pain-go back to using night splints for the  current heel pain although the other plantar fascial pain has improved  Achilles tendinitis and lateral foot pain-we discussed wearing supportive shoe wear.  She will look into other shoes for now but consider x-ray going forward.  She has flat arches  Hyperlipidemia, xanthelasma-advised we try again on a different statin.  She had memory fall while on prior statin.  we discussed of risk and benefits of medication  Hypertension-continue current medication  Vitamin D deficiency-continue supplement  Obesity-discussed the need to work on exercise, healthy diet changes and losing weight  She will schedule bone density given history of postmenopausal estrogen deficiency  Microcytosis on recent lab-check iron and B12 today  Impaired glucose-diabetes screening labs today    Ellis was seen today for medication management and annual exam.  Diagnoses and all orders for this visit:  Encounter for health maintenance examination in adult -     Hemoglobin A1c -     Lipid panel; Future -     VITAMIN D 25 Hydroxy (Vit-D Deficiency, Fractures) -     TSH -     Vitamin B12 -     EKG 12-Lead -     Iron  Essential hypertension, benign  Thyroid disease -     TSH  Hyperlipidemia, unspecified hyperlipidemia type -     Lipid panel; Future  Vitamin D deficiency -     VITAMIN D 25 Hydroxy (Vit-D Deficiency, Fractures)  Morbid obesity ([facility])  Screening for heart disease  Encounter for screening mammogram for malignant  neoplasm of breast  Family history of multiple myeloma  Post-menopausal  Estrogen deficiency  Polyp of colon, unspecified part of colon, unspecified type  Screen for colon cancer  Unilateral primary osteoarthritis, left knee  Plantar fascia syndrome  S/P hysterectomy  Anxiety and depression  Vaccine counseling  History of bronchitis  Chronic pain of left knee  Family history of osteoporosis  Family history of colon cancer  Frequent stools  Xanthelasma  Microcytosis -     Vitamin B12 -     Iron  Impaired fasting blood sugar -     Hemoglobin A1c    Follow-up pending labs, yearly for physical

## 2019-03-08 LAB — VITAMIN B12: Vitamin B-12: 525 pg/mL (ref 232–1245)

## 2019-03-08 LAB — IRON: Iron: 49 ug/dL (ref 27–139)

## 2019-03-08 LAB — HEMOGLOBIN A1C
Est. average glucose Bld gHb Est-mCnc: 134 mg/dL
Hgb A1c MFr Bld: 6.3 % — ABNORMAL HIGH (ref 4.8–5.6)

## 2019-03-08 LAB — VITAMIN D 25 HYDROXY (VIT D DEFICIENCY, FRACTURES): Vit D, 25-Hydroxy: 25.5 ng/mL — ABNORMAL LOW (ref 30.0–100.0)

## 2019-03-08 LAB — TSH: TSH: 0.611 u[IU]/mL (ref 0.450–4.500)

## 2019-03-10 ENCOUNTER — Other Ambulatory Visit: Payer: PRIVATE HEALTH INSURANCE

## 2019-03-10 ENCOUNTER — Other Ambulatory Visit: Payer: Self-pay | Admitting: Medical

## 2019-03-10 ENCOUNTER — Telehealth: Payer: Self-pay

## 2019-03-10 ENCOUNTER — Other Ambulatory Visit: Payer: Self-pay

## 2019-03-10 DIAGNOSIS — E785 Hyperlipidemia, unspecified: Secondary | ICD-10-CM

## 2019-03-10 DIAGNOSIS — Z0000 Encounter for general adult medical examination without abnormal findings: Secondary | ICD-10-CM

## 2019-03-10 MED ORDER — ROSUVASTATIN CALCIUM 5 MG PO TABS: 5.0000 mg | ORAL_TABLET | Freq: Every day | ORAL | 3 refills | Status: AC

## 2019-03-10 MED ORDER — HYDROCORTISONE ACETATE 25 MG RE SUPP: 25.0000 mg | Freq: Two times a day (BID) | RECTAL | 1 refills | Status: AC

## 2019-03-10 MED ORDER — HYDROCORTISONE VALERATE 0.2 % EX OINT: TOPICAL_OINTMENT | CUTANEOUS | 1 refills | Status: DC

## 2019-03-10 MED ORDER — HYDROCORTISONE VALERATE 0.2 % EX OINT: TOPICAL_OINTMENT | CUTANEOUS | 1 refills | Status: AC

## 2019-03-10 MED ORDER — VITAMIN D 25 MCG (1000 UNIT) PO TABS: 2000.0000 [IU] | ORAL_TABLET | Freq: Every day | ORAL | 3 refills | Status: AC

## 2019-03-10 MED ORDER — CITALOPRAM HYDROBROMIDE 40 MG PO TABS: 40.0000 mg | ORAL_TABLET | Freq: Every day | ORAL | 1 refills | Status: DC

## 2019-03-10 MED ORDER — LOSARTAN POTASSIUM-HCTZ 50-12.5 MG PO TABS: 1.0000 | ORAL_TABLET | Freq: Every day | ORAL | 3 refills | Status: AC

## 2019-03-10 MED ORDER — HYDROCORTISONE ACETATE 25 MG RE SUPP: 25.0000 mg | Freq: Two times a day (BID) | RECTAL | 1 refills | Status: DC

## 2019-03-10 NOTE — Telephone Encounter (Signed)
I got a call from [facility] stating that thye do not carry the hydrocortisone valerate ointment 0.2% it only comes in a cream which the pt. Needs to get at her regular pharmacy so if you could send it in for the cream to her regular pharmacy.

## 2019-03-10 NOTE — Telephone Encounter (Signed)
I fixed it.  I accidentally sent the cream to [facility] and suppository to [facility].  These should have been the reverse which I corrected.

## 2019-03-11 LAB — LIPID PANEL
Chol/HDL Ratio: 6.1 ratio — ABNORMAL HIGH (ref 0.0–4.4)
Cholesterol, Total: 273 mg/dL — ABNORMAL HIGH (ref 100–199)
HDL: 45 mg/dL (ref >39)
LDL Chol Calc (NIH): 211 mg/dL — ABNORMAL HIGH (ref 0–99)
Triglycerides: 96 mg/dL (ref 0–149)
VLDL Cholesterol Cal: 17 mg/dL (ref 5–40)

## 2019-03-17 NOTE — Telephone Encounter (Signed)
error 

## 2019-04-01 ENCOUNTER — Ambulatory Visit (INDEPENDENT_AMBULATORY_CARE_PROVIDER_SITE_OTHER): Payer: PRIVATE HEALTH INSURANCE | Admitting: Gastroenterology

## 2019-04-01 ENCOUNTER — Encounter: Payer: Self-pay | Admitting: Gastroenterology

## 2019-04-01 VITALS — BP 150/86 | HR 72 | Temp 98.7°F | Ht 67.0 in | Wt 278.0 lb

## 2019-04-01 DIAGNOSIS — K625 Hemorrhage of anus and rectum: Secondary | ICD-10-CM | POA: Diagnosis not present

## 2019-04-01 DIAGNOSIS — K5900 Constipation, unspecified: Secondary | ICD-10-CM

## 2019-04-01 MED ORDER — SUPREP BOWEL PREP KIT 17.5-3.13-1.6 GM/177ML PO SOLN: 1.0000 | ORAL | 0 refills | Status: AC

## 2019-04-01 NOTE — Progress Notes (Signed)
HPI: This is a very pleasant 64 year old woman who was referred to me by Carlena Hurl, PA-C  to evaluate minor rectal bleeding.    She has had intermittent minor rectal bleeding for several years.  This is a small amount of blood on the tissue paper when she wipes about 2 or 3 times per month.  She does not have any anal discomforts.  She does tend to have to push and strain to move her bowels periodically.  She is taking a product called "oil of oregano" which she tells me helps her minor intermittent constipation.  Colon cancer does not actually run in her family.  Her paternal grandmother had stomach cancer, her father died of some type of cancer which caused jaundice.  She has no abdominal pains.  Her weight is up recently.  She is battling trying to lose her weight  Old Data Reviewed:  She underwent a screening colonoscopy at another facility August 2013.  A single subcentimeter polyp was removed and it was proven to be hyperplastic on pathology.   Review of systems: Pertinent positive and negative review of systems were noted in the above HPI section. All other review negative.   Past Medical History:  Diagnosis Date  . Abdominal discomfort   . Allergy   . Anxiety   . Blood in stool   . Depression    [facility] at [facility]  . Hyperlipidemia   . Hypertension   . Thyroid disease     Past Surgical History:  Procedure Laterality Date  . ABDOMINAL HYSTERECTOMY  1995   due to fibroid uterus, still has ovaries  . COLONOSCOPY  2013   polyps    Current Outpatient Medications  Medication Sig Dispense Refill  . albuterol (VENTOLIN HFA) 108 (90 Base) MCG/ACT inhaler TAKE 2 PUFFS BY MOUTH EVERY 6 HOURS AS NEEDED FOR WHEEZE OR SHORTNESS OF BREATH 18 g 0  . cholecalciferol (VITAMIN D3) 25 MCG (1000 UNIT) tablet Take 2 tablets (2,000 Units total) by mouth daily. 180 tablet 3  . citalopram (CELEXA) 40 MG tablet Take 1 tablet (40 mg total) by mouth daily. 90 tablet 1  .  hydrocortisone (ANUSOL-HC) 25 MG suppository Place 1 suppository (25 mg total) rectally every 12 (twelve) hours. 12 suppository 1  . hydrocortisone valerate ointment (WESTCORT) 0.2 % Apply to affected area daily 45 g 1  . ibuprofen (ADVIL) 800 MG tablet Take 1 tablet (800 mg total) by mouth every 8 (eight) hours as needed. 90 tablet 0  . losartan-hydrochlorothiazide (HYZAAR) 50-12.5 MG tablet Take 1 tablet by mouth daily. 90 tablet 3  . rosuvastatin (CRESTOR) 5 MG tablet Take 1 tablet (5 mg total) by mouth at bedtime. 90 tablet 3  . terbinafine (LAMISIL AT) 1 % cream Apply 1 application topically 2 (two) times daily. 30 g 0   No current facility-administered medications for this visit.    Allergies as of 04/01/2019 - Review Complete 04/01/2019  Allergen Reaction Noted  . Lisinopril  04/10/2018  . Simvastatin  04/10/2018    Family History  Problem Relation Age of Onset  . Breast cancer Mother   . Cancer Mother        multiple myeloma  . Glaucoma Mother   . Cancer Father        stomach and liver  . Cancer Maternal Grandmother        colon    Social History   Socioeconomic History  . Marital status: Single  Spouse name: Not on file  . Number of children: Not on file  . Years of education: Not on file  . Highest education level: Not on file  Occupational History  . Not on file  Tobacco Use  . Smoking status: Former Smoker    Types: Cigarettes  . Smokeless tobacco: Never Used  Substance and Sexual Activity  . Alcohol use: No  . Drug use: No  . Sexual activity: Yes    Birth control/protection: Surgical  Other Topics Concern  . Not on file  Social History Narrative   Lives alone.  Was homeless back in 2000 for short period of time.   Is a caregiver.  Works Multimedia programmer as well.  Exercise - not a whole lot.    03/2018.   Social Determinants of Health   Financial Resource Strain:   . Difficulty of Paying Living Expenses:   Food Insecurity:   . Worried About Paediatric nurse in the Last Year:   . Arboriculturist in the Last Year:   Transportation Needs:   . Film/video editor (Medical):   Marland Kitchen Lack of Transportation (Non-Medical):   Physical Activity:   . Days of Exercise per Week:   . Minutes of Exercise per Session:   Stress:   . Feeling of Stress :   Social Connections:   . Frequency of Communication with Friends and Family:   . Frequency of Social Gatherings with Friends and Family:   . Attends Religious Services:   . Active Member of Clubs or Organizations:   . Attends Archivist Meetings:   Marland Kitchen Marital Status:   Intimate Partner Violence:   . Fear of Current or Ex-Partner:   . Emotionally Abused:   Marland Kitchen Physically Abused:   . Sexually Abused:      Physical Exam: BP (!) 150/86   Pulse 72   Temp 98.7 F (37.1 C)   Ht 5' 7" (1.702 m)   Wt 278 lb (126.1 kg)   BMI 43.54 kg/m  Constitutional: generally well-appearing, morbidly obese Psychiatric: alert and oriented x3 Eyes: extraocular movements intact Mouth: oral pharynx moist, no lesions Neck: supple no lymphadenopathy Cardiovascular: heart regular rate and rhythm Lungs: clear to auscultation bilaterally Abdomen: soft, nontender, nondistended, no obvious ascites, no peritoneal signs, normal bowel sounds Extremities: no lower extremity edema bilaterally Skin: no lesions on visible extremities Rectal exam deferred for upcoming colonoscopy  Assessment and plan: 64 y.o. female with minor intermittent constipation, minor rectal bleeding  First I recommended a colonoscopy to exclude neoplastic causes.  I think that that is unlikely however.  Most likely she has minor hemorrhoids given the nature and time course of her bleeding.  I recommended she consider taking her "oil of oregano" on a more regular basis since it seems to help.  She has been using suppositories for the minor rectal bleeding which I explained to her that she probably does not need if it is such minor  bleeding.  If she has an internal hemorrhoid component which I will determine at the time of her upcoming colonoscopy then I would likely recommend she sit down to consider hemorrhoid banding procedure.  I see no reason for any further blood tests or imaging studies at this point  Please see the "Patient Instructions" section for addition details about the plan.   Owens Loffler, MD [facility] 04/01/2019, 8:58 AM  Cc: Carlena Hurl, PA-C  Total time on date of encounter was  45  minutes (this included time spent preparing to see the patient reviewing records; obtaining and/or reviewing separately obtained history; performing a medically appropriate exam and/or evaluation; counseling and educating the patient and family if present; ordering medications, tests or procedures if applicable; and documenting clinical information in the health record).

## 2019-04-01 NOTE — Patient Instructions (Signed)
You have been scheduled for a colonoscopy. Please follow written instructions given to you at your visit today.  Please pick up your prep supplies at the pharmacy within the next 1-3 days. If you use inhalers (even only as needed), please bring them with you on the day of your procedure. Your physician has requested that you go to www.startemmi.com and enter the access code given to you at your visit today. This web site gives a general overview about your procedure. However, you should still follow specific instructions given to you by our office regarding your preparation for the procedure.   If you are age 41 or older, your body mass index should be between 23-30. Your Body mass index is 43.54 kg/m. If this is out of the aforementioned range listed, please consider follow up with your Primary Care Provider.  If you are age 46 or younger, your body mass index should be between 19-25. Your Body mass index is 43.54 kg/m. If this is out of the aformentioned range listed, please consider follow up with your Primary Care Provider.    Due to recent changes in healthcare laws, you may see the results of your imaging and laboratory studies on MyChart before your provider has had a chance to review them.  We understand that in some cases there may be results that are confusing or concerning to you. Not all laboratory results come back in the same time frame and the provider may be waiting for multiple results in order to interpret others.  Please give [place] 48 hours in order for your provider to thoroughly review all the results before contacting the office for clarification of your results.

## 2019-04-13 ENCOUNTER — Other Ambulatory Visit: Payer: Self-pay | Admitting: Medical

## 2019-04-24 ENCOUNTER — Other Ambulatory Visit: Payer: Self-pay | Admitting: Medical

## 2019-04-29 ENCOUNTER — Telehealth: Payer: Self-pay | Admitting: Gastroenterology

## 2019-04-29 NOTE — Telephone Encounter (Signed)
Hi Dr. Christella Hartigan, this pt just rescheduled her colonoscopy that was scheduled with you for tomorrow 4/14. She stated that she developed diarrhea last night and is not feeling well this morning. 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 She has rescheduled to 06/18/19. Thank you.

## 2019-04-30 ENCOUNTER — Encounter: Payer: PRIVATE HEALTH INSURANCE | Admitting: Gastroenterology

## 2019-06-13 ENCOUNTER — Encounter: Payer: Self-pay | Admitting: Gastroenterology

## 2019-06-18 ENCOUNTER — Encounter: Payer: PRIVATE HEALTH INSURANCE | Admitting: Gastroenterology

## 2019-07-15 ENCOUNTER — Telehealth: Payer: Self-pay | Admitting: *Deleted

## 2019-07-15 NOTE — Telephone Encounter (Signed)
I think it is okay to continue with colonoscopy in late July as currently scheduled without need for office visit beforehand.  Thank you

## 2019-07-15 NOTE — Telephone Encounter (Signed)
Dr Christella Hartigan,  You saw this patient 04-01-2019 for rectal bleeding and constipation- she was scheduled for a colon 04-2019 and she canceled- She had a colon 2013 with 1 HPP- she has rescheduled her colon for 7-23 Friday-  Per Dr Myrtie Neither' recommendations for repeat OV if had OV , procedure scheduled , procedure canceled and now procedure RS ,  I am asking if you want to see her in the office again since it has been 3 months as  she was having rectal bleeding or can we proceed as scheduled for a colon 7-23 for rectal bleeding?  Thanks, Hilda Lias PV

## 2019-07-31 ENCOUNTER — Telehealth: Payer: Self-pay

## 2019-07-31 ENCOUNTER — Other Ambulatory Visit: Payer: Self-pay | Admitting: Medical

## 2019-07-31 MED ORDER — CITALOPRAM HYDROBROMIDE 40 MG PO TABS: 40.0000 mg | ORAL_TABLET | Freq: Every day | ORAL | 0 refills | Status: DC

## 2019-07-31 MED ORDER — BUSPIRONE HCL 10 MG PO TABS: 10.0000 mg | ORAL_TABLET | Freq: Two times a day (BID) | ORAL | 0 refills | Status: DC

## 2019-07-31 NOTE — Telephone Encounter (Signed)
I refilled Celexa and BuSpar.   follow-up next week as planned

## 2019-07-31 NOTE — Telephone Encounter (Signed)
Called patient, no answer. Sent patient a message via mychart.  

## 2019-07-31 NOTE — Telephone Encounter (Signed)
Patient called and stated she has terrible anxiety and she is itching. She has not taken her Celexa because she lost it but she will pick that up tomorrow from pharmacy. Pleases advise patient wants to be back on Buspar

## 2019-08-05 ENCOUNTER — Telehealth: Payer: PRIVATE HEALTH INSURANCE | Admitting: Medical

## 2019-08-08 ENCOUNTER — Encounter: Payer: PRIVATE HEALTH INSURANCE | Admitting: Gastroenterology

## 2019-09-16 ENCOUNTER — Encounter: Payer: PRIVATE HEALTH INSURANCE | Admitting: Gastroenterology

## 2019-09-20 ENCOUNTER — Other Ambulatory Visit: Payer: Self-pay | Admitting: Medical

## 2019-12-08 ENCOUNTER — Other Ambulatory Visit: Payer: Self-pay | Admitting: Medical

## 2019-12-08 NOTE — Telephone Encounter (Signed)
Refill #90 but schedule 5mo med check

## 2019-12-08 NOTE — Telephone Encounter (Signed)
Schedule 66mo med check.  Send refill #90

## 2020-02-07 ENCOUNTER — Other Ambulatory Visit: Payer: Self-pay | Admitting: Medical

## 2020-02-10 NOTE — Telephone Encounter (Signed)
Tried contacting patient several times. Patient in past schedules appointment and cancels after med is sent. Please advise.

## 2020-02-10 NOTE — Telephone Encounter (Signed)
Refill 30 days and get in for yearly fasting physical

## 2020-03-24 ENCOUNTER — Ambulatory Visit: Payer: PRIVATE HEALTH INSURANCE | Admitting: Medical

## 2021-01-27 ENCOUNTER — Telehealth: Payer: Self-pay | Admitting: Medical

## 2021-01-27 NOTE — Telephone Encounter (Signed)
Dismissal letter in guarantor snapshot  °

## 2021-06-27 ENCOUNTER — Encounter: Payer: Self-pay | Admitting: Gastroenterology
